# Patient Record
Sex: Female | Born: 1970 | Race: Asian | Hispanic: No | Marital: Married | Smoking: Never smoker
Health system: Southern US, Community
[De-identification: ages and names within clinical notes are randomized; demographics above are authoritative.]

## PROBLEM LIST (undated history)

## (undated) DIAGNOSIS — E785 Hyperlipidemia, unspecified: Secondary | ICD-10-CM

## (undated) DIAGNOSIS — I10 Essential (primary) hypertension: Secondary | ICD-10-CM

---

## 1998-05-14 ENCOUNTER — Other Ambulatory Visit: Admission: RE | Admit: 1998-05-14 | Discharge: 1998-05-14 | Payer: Self-pay | Admitting: Gynecology

## 1999-08-15 ENCOUNTER — Other Ambulatory Visit: Admission: RE | Admit: 1999-08-15 | Discharge: 1999-08-15 | Payer: Self-pay | Admitting: Gynecology

## 2002-01-21 ENCOUNTER — Inpatient Hospital Stay (HOSPITAL_COMMUNITY): Admission: AD | Admit: 2002-01-21 | Discharge: 2002-01-24 | Payer: Self-pay | Admitting: *Deleted

## 2002-03-12 ENCOUNTER — Other Ambulatory Visit: Admission: RE | Admit: 2002-03-12 | Discharge: 2002-03-12 | Payer: Self-pay | Admitting: *Deleted

## 2003-03-16 ENCOUNTER — Other Ambulatory Visit: Admission: RE | Admit: 2003-03-16 | Discharge: 2003-03-16 | Payer: Self-pay | Admitting: Gynecology

## 2004-03-18 ENCOUNTER — Other Ambulatory Visit: Admission: RE | Admit: 2004-03-18 | Discharge: 2004-03-18 | Payer: Self-pay | Admitting: Gynecology

## 2004-08-01 ENCOUNTER — Other Ambulatory Visit: Admission: RE | Admit: 2004-08-01 | Discharge: 2004-08-01 | Payer: Self-pay | Admitting: Gynecology

## 2005-03-24 ENCOUNTER — Other Ambulatory Visit: Admission: RE | Admit: 2005-03-24 | Discharge: 2005-03-24 | Payer: Self-pay | Admitting: Gynecology

## 2006-04-13 ENCOUNTER — Other Ambulatory Visit: Admission: RE | Admit: 2006-04-13 | Discharge: 2006-04-13 | Payer: Self-pay | Admitting: Gynecology

## 2009-04-28 ENCOUNTER — Ambulatory Visit: Payer: Self-pay | Admitting: Physician Assistant

## 2009-04-28 DIAGNOSIS — M722 Plantar fascial fibromatosis: Secondary | ICD-10-CM | POA: Insufficient documentation

## 2009-04-28 DIAGNOSIS — I10 Essential (primary) hypertension: Secondary | ICD-10-CM | POA: Insufficient documentation

## 2009-04-28 DIAGNOSIS — E785 Hyperlipidemia, unspecified: Secondary | ICD-10-CM

## 2009-04-28 LAB — CONVERTED CEMR LAB
AST: 15 units/L (ref 0–37)
Amphetamine Screen, Ur: NEGATIVE
Barbiturate Quant, Ur: NEGATIVE
Basophils Absolute: 0 10*3/uL (ref 0.0–0.1)
Benzodiazepines.: NEGATIVE
CO2: 23 meq/L (ref 19–32)
Chloride: 105 meq/L (ref 96–112)
Cocaine Metabolites: NEGATIVE
Creatinine,U: 164.4 mg/dL
Eosinophils Absolute: 0.1 10*3/uL (ref 0.0–0.7)
Eosinophils Relative: 2 % (ref 0–5)
HCT: 45.8 % (ref 36.0–46.0)
Lymphs Abs: 2.8 10*3/uL (ref 0.7–4.0)
MCHC: 31.9 g/dL (ref 30.0–36.0)
MCV: 90.5 fL (ref 78.0–100.0)
Marijuana Metabolite: NEGATIVE
Monocytes Absolute: 0.6 10*3/uL (ref 0.1–1.0)
Monocytes Relative: 7 % (ref 3–12)
Phencyclidine (PCP): NEGATIVE
Potassium: 4 meq/L (ref 3.5–5.3)
Total Bilirubin: 0.3 mg/dL (ref 0.3–1.2)
Total Protein: 7.2 g/dL (ref 6.0–8.3)

## 2009-04-29 ENCOUNTER — Encounter: Payer: Self-pay | Admitting: Physician Assistant

## 2009-05-04 ENCOUNTER — Encounter: Payer: Self-pay | Admitting: Physician Assistant

## 2009-05-05 ENCOUNTER — Telehealth (INDEPENDENT_AMBULATORY_CARE_PROVIDER_SITE_OTHER): Payer: Self-pay | Admitting: *Deleted

## 2009-05-06 ENCOUNTER — Encounter: Payer: Self-pay | Admitting: Physician Assistant

## 2009-05-27 ENCOUNTER — Ambulatory Visit: Payer: Self-pay | Admitting: Physician Assistant

## 2009-06-01 LAB — CONVERTED CEMR LAB: Triglycerides: 207 mg/dL — ABNORMAL HIGH (ref <150)

## 2009-06-09 ENCOUNTER — Ambulatory Visit: Payer: Self-pay | Admitting: Physician Assistant

## 2009-06-09 DIAGNOSIS — I10 Essential (primary) hypertension: Secondary | ICD-10-CM | POA: Insufficient documentation

## 2009-06-10 ENCOUNTER — Encounter: Payer: Self-pay | Admitting: Physician Assistant

## 2009-06-11 ENCOUNTER — Telehealth: Payer: Self-pay | Admitting: Physician Assistant

## 2009-08-16 ENCOUNTER — Ambulatory Visit: Payer: Self-pay | Admitting: Physician Assistant

## 2009-08-16 ENCOUNTER — Other Ambulatory Visit: Admission: RE | Admit: 2009-08-16 | Discharge: 2009-08-16 | Payer: Self-pay | Admitting: Internal Medicine

## 2009-08-16 DIAGNOSIS — H6120 Impacted cerumen, unspecified ear: Secondary | ICD-10-CM

## 2009-08-16 DIAGNOSIS — R319 Hematuria, unspecified: Secondary | ICD-10-CM

## 2009-08-16 LAB — CONVERTED CEMR LAB
Glucose, Urine, Semiquant: NEGATIVE
Nitrite: NEGATIVE
Protein, U semiquant: NEGATIVE
Specific Gravity, Urine: >=1.03
WBC Urine, dipstick: NEGATIVE
Whiff Test: NEGATIVE

## 2009-08-17 ENCOUNTER — Encounter: Payer: Self-pay | Admitting: Physician Assistant

## 2009-08-17 LAB — CONVERTED CEMR LAB
Alkaline Phosphatase: 32 units/L — ABNORMAL LOW (ref 39–117)
Bacteria, UA: NONE SEEN
Calcium: 9.7 mg/dL (ref 8.4–10.5)
Chloride: 105 meq/L (ref 96–112)
Cholesterol: 232 mg/dL — ABNORMAL HIGH (ref 0–200)
Crystals: NONE SEEN
Glucose, Bld: 86 mg/dL (ref 70–99)
HDL goal, serum: 40 mg/dL
Sodium: 139 meq/L (ref 135–145)
Total Bilirubin: 0.4 mg/dL (ref 0.3–1.2)
Total Protein: 7.6 g/dL (ref 6.0–8.3)
VLDL: 26 mg/dL (ref 0–40)
WBC, UA: NONE SEEN cells/hpf (ref <3)

## 2009-08-18 ENCOUNTER — Encounter: Payer: Self-pay | Admitting: Physician Assistant

## 2009-08-25 ENCOUNTER — Encounter: Payer: Self-pay | Admitting: Physician Assistant

## 2009-08-25 ENCOUNTER — Ambulatory Visit (HOSPITAL_COMMUNITY): Admission: RE | Admit: 2009-08-25 | Discharge: 2009-08-25 | Payer: Self-pay | Admitting: Internal Medicine

## 2009-08-25 IMAGING — MG MM DIGITAL SCREENING BILAT W/ CAD
6 series · 6 of 6 positions shown · non-contrast
Comparison: none

DG SCREEN MAMMOGRAM BILATERAL
Bilateral CC and MLO view(s) were taken.
Technologist: FLASH, RT, RM

DIGITAL SCREENING MAMMOGRAM WITH CAD:
The breast tissue is extremely dense.  There is no dominant mass, architectural distortion or 
calcification to suggest malignancy.
Images were processed with CAD.

[R CC]
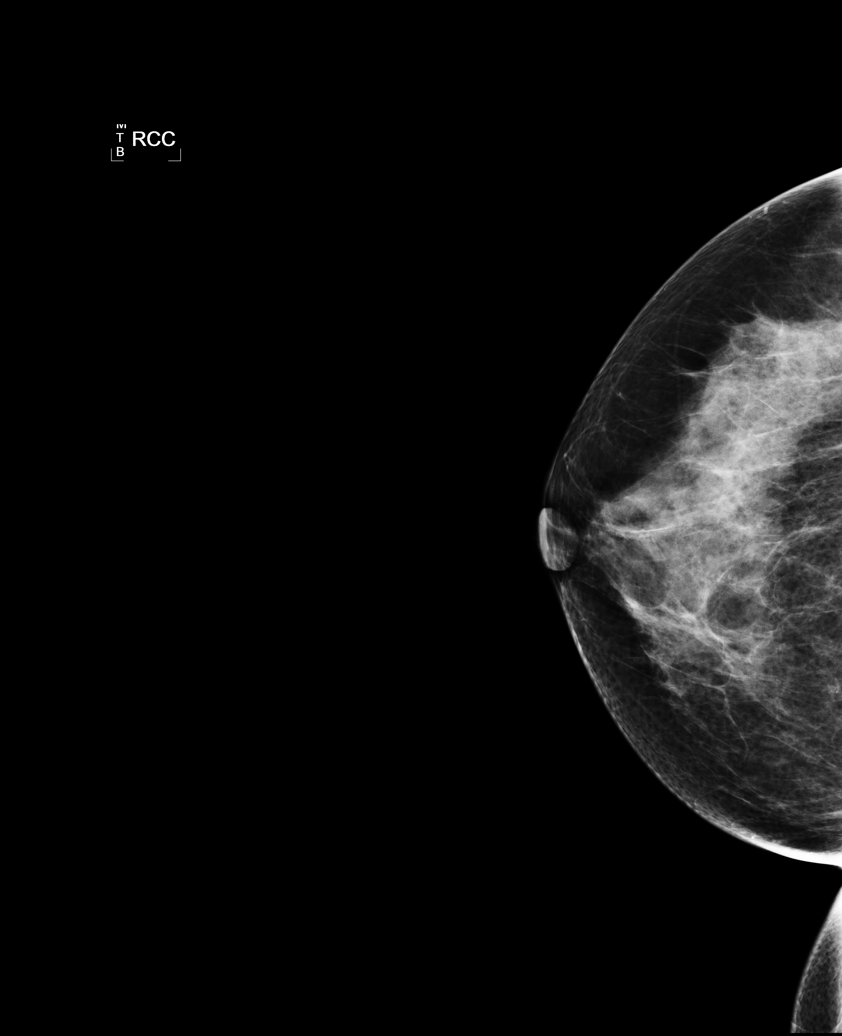

[R MLO (1 of 2)]
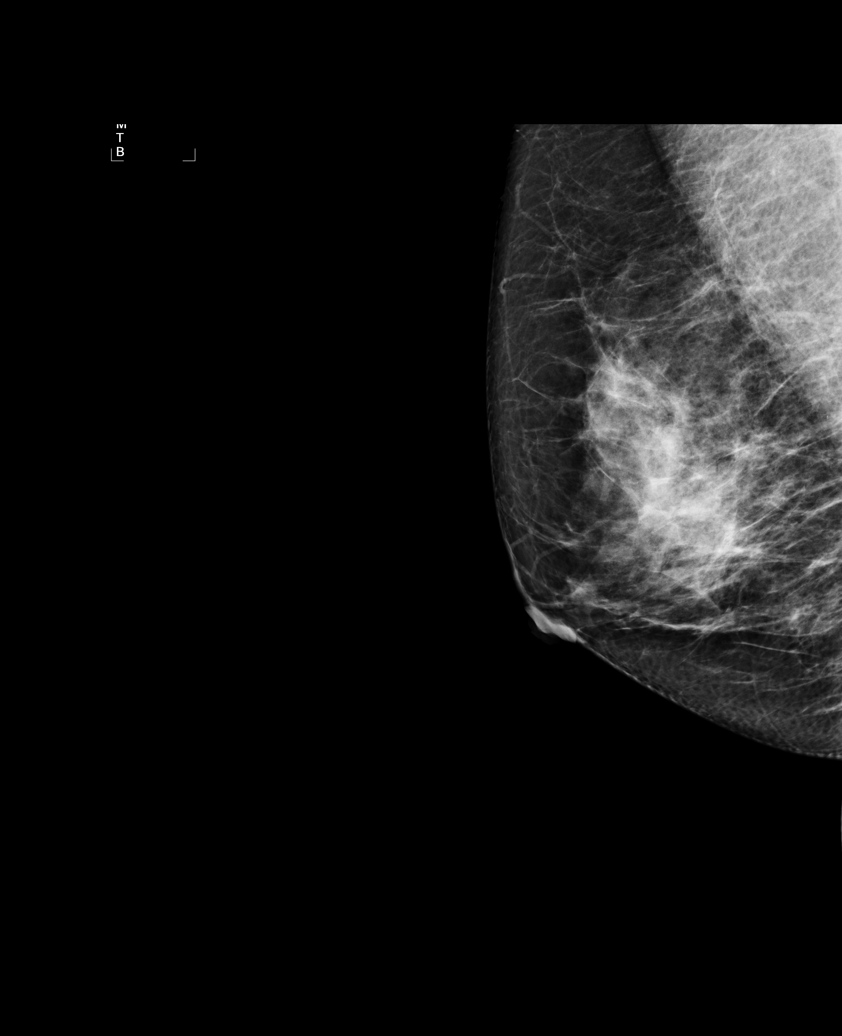

[L CC]
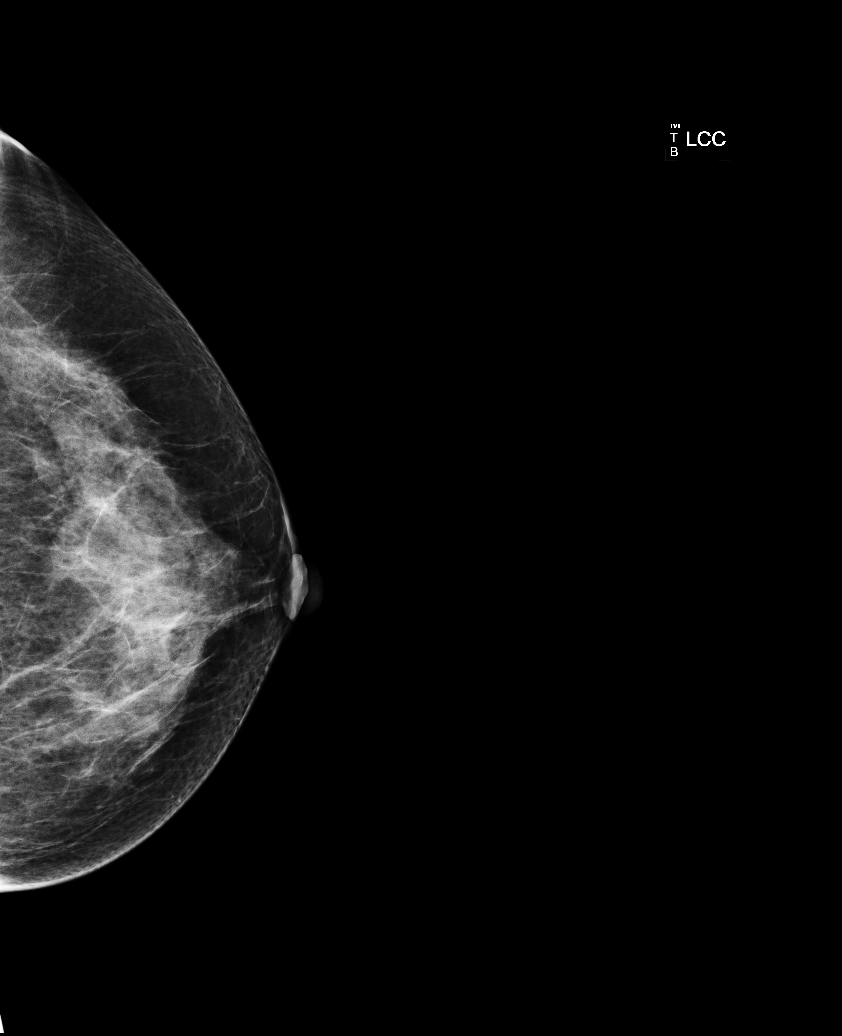

[L MLO (1 of 2)]
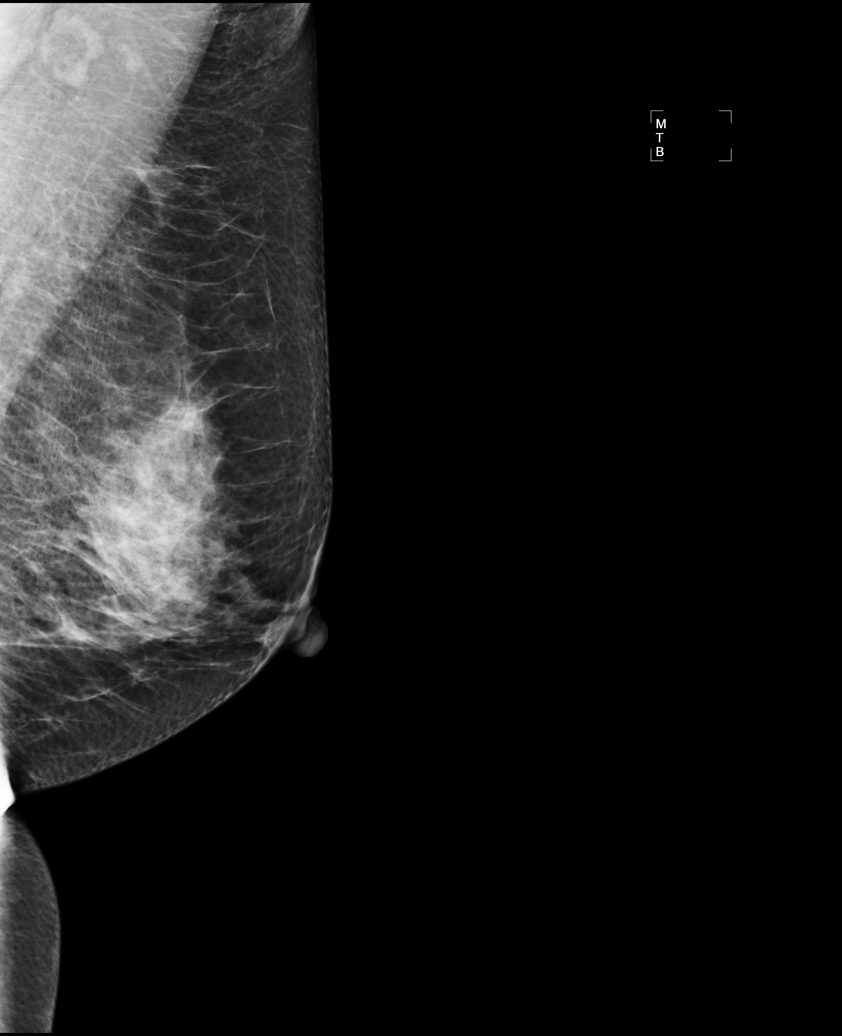

[L MLO (2 of 2)]
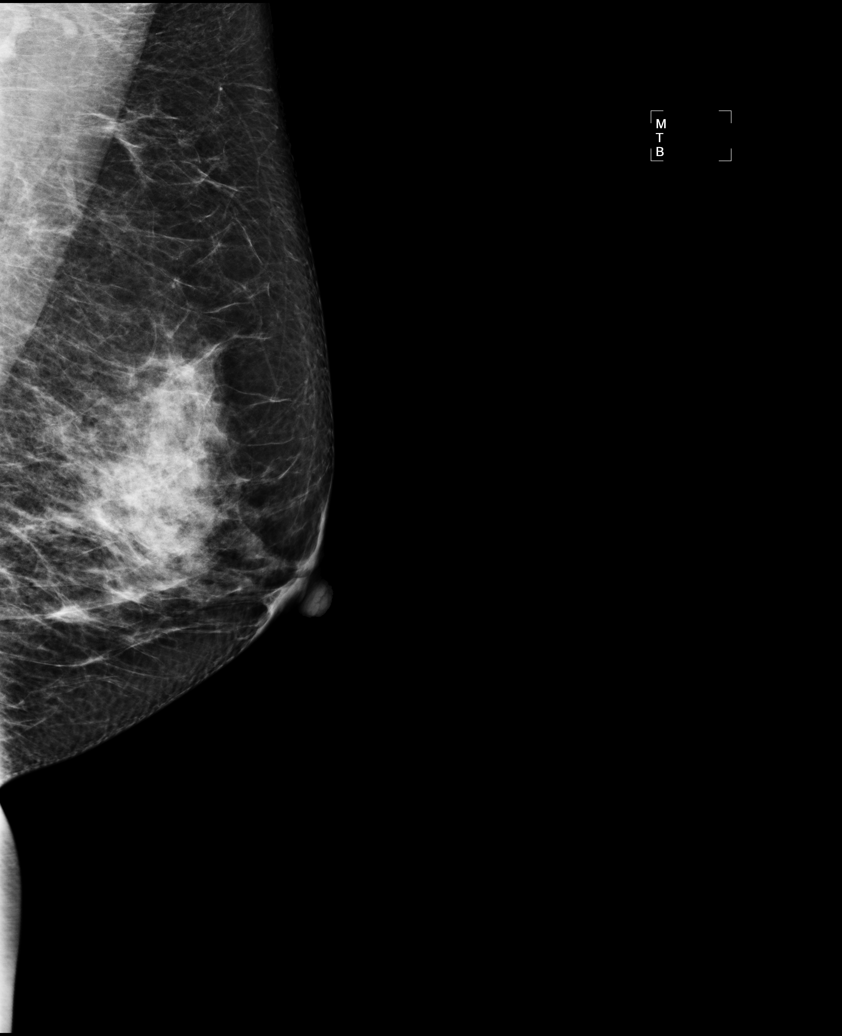

[R MLO (2 of 2)]
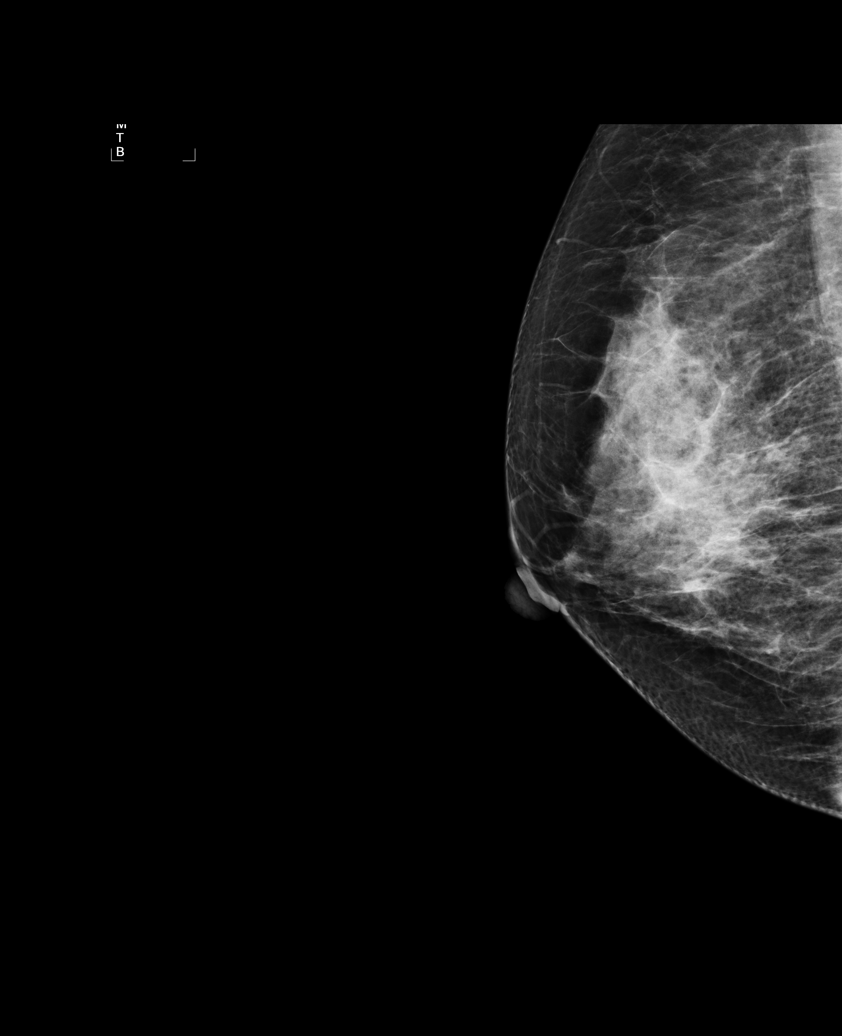

[6 of 6 positions shown; findings below may reference images not displayed]

IMPRESSION: No mammographic evidence of malignancy.  Suggest yearly screening mammography.

A result letter of this screening mammogram will be mailed directly to the patient.

ASSESSMENT: Negative - BI-RADS 1

Screening mammogram in 1 year.
,

## 2009-09-17 ENCOUNTER — Ambulatory Visit: Payer: Self-pay | Admitting: Nurse Practitioner

## 2009-09-17 DIAGNOSIS — J00 Acute nasopharyngitis [common cold]: Secondary | ICD-10-CM

## 2010-03-06 LAB — CONVERTED CEMR LAB
BUN: 11 mg/dL (ref 6–23)
Calcium: 9.5 mg/dL (ref 8.4–10.5)
Glucose, Bld: 101 mg/dL — ABNORMAL HIGH (ref 70–99)

## 2010-03-10 NOTE — Progress Notes (Signed)
  Phone Note Outgoing Call   Summary of Call: We cannot find a record of her pap or mammo. Rec'd correspondence from [facility]' and they have not seen her. Have her schedule a CPP in the next 3-6 months. Initial call taken by: Rich Rich,  May 05, 2009 3:22 PM  Follow-up for Phone Call        tried calling pt but no answer ,,..Rich Rich  May 11, 2009 11:42 AM Rich Rich can you reach this pt to schedule an appt in the next 3-6 months for CPP Follow-up by: Rich Rich,  May 11, 2009 11:42 AM  Additional Follow-up for Phone Call Additional follow up Details #1::        left a message with the contact person for the pt to call us back.Rich Rich  May 11, 2009 3:44 PM  The pt will come on July 11, at 9:30 am.Rich Rich  May 14, 2009 3:20 PM

## 2010-03-10 NOTE — Letter (Signed)
Summary: [facility] RECORDS [facility]NO REC[facility]DS FOUND  REQUESTED RECORDS [facility]//NO RECORDS FOUND   Imported By: Arta Bruce 05/06/2009 09:42:09  _____________________________________________________________________  External Attachment:    Type:   Image     Comment:   External Document

## 2010-03-10 NOTE — Letter (Signed)
Summary: *HSN Results Follow up  [facility]  [address] [place], [place] [address]   Phone: [phone]  Fax: [phone]      06/10/2009   Shannon Rich [address] [place], [place]  [address]   Dear  [name],                            ____S.Drinkard,FNP   ____D. Gore,FNP       ____B. McPherson,MD   ____V. Rankins,MD    ____E. Mulberry,MD    ____N. Daphine Deutscher, FNP  ____D. Reche Dixon, MD    ____K. Philipp Deputy, MD    __x__S. Alben Spittle, PA-C     This letter is to inform you that your recent test(s):  _______Pap Smear    __x_____Lab Test     _______X-ray    ____x___ is within acceptable limits  _______ requires a medication change  _______ requires a follow-up lab visit  _______ requires a follow-up visit with your provider   Comments:       _________________________________________________________ If you have any questions, please contact our office                     Sincerely,  Tereso Newcomer PA-C [facility]

## 2010-03-10 NOTE — Assessment & Plan Note (Signed)
Summary: cpp exam//gk   Vital Signs:  Patient profile:   41 year old female Menstrual status:  regular Height:      61.5 inches Weight:      149 pounds BMI:     27.80 Temp:     98.2 degrees F Pulse rate:   80 / minute Pulse rhythm:   regular Resp:     18 per minute BP sitting:   137 / 85  (left arm) Cuff size:   regular  Primary Care Provider:  Tereso Newcomer, PA-C  CC:  cpp.  History of Present Illness: Here for CPP. Health Maint: LMP 07/21/2009 No h/o abnormal paps. No abnormal bleeding or discharge. Periods regular.  No heavy bleeding.   No mammo in 2 years. PHQ9=0 Married.  Monogamous.  No h/o STD.   Hypertension History:      Positive major cardiovascular risk factors include hyperlipidemia and hypertension.  Negative major cardiovascular risk factors include female age less than 69 years old and non-tobacco-user status.    Habits & Providers  Alcohol-Tobacco-Diet     Alcohol drinks/day: 0     Tobacco Status: never  Exercise-Depression-Behavior     Have you felt down or hopeless? no     Have you felt little pleasure in things? no     Drug Use: never     Seat Belt Use: always  Problems Prior to Update: 1)  Hematuria Unspecified  (ICD-599.70) 2)  Cerumen Impaction, Right  (ICD-380.4) 3)  Essential Hypertension, Benign  (ICD-401.1) 4)  Plantar Fasciitis, Bilateral  (ICD-728.71) 5)  Preventive Health Care  (ICD-V70.0) 6)  Hyperlipidemia  (ICD-272.4)  Current Medications (verified): 1)  Hydrochlorothiazide 12.5 Mg Caps (Hydrochlorothiazide) .Marland Kitchen.. 1 By Mouth Once Daily 2)  Simvastatin 40 Mg Tabs (Simvastatin) .... Take 1 Tab By Mouth At Bedtime For Cholesterol 3)  Naproxen 500 Mg Tabs (Naproxen) .... Take 1 Tablet By Mouth Two Times A Day With Food As Needed For Pain 4)  Voltaren 1 % Gel (Diclofenac Sodium) .... Apply 4 Grams To Affected Area Up To 4 Times A Day As Needed For Pain  Allergies (verified): No Known Drug Allergies  Past History:  Past Medical  History: Last updated: 04/28/2009 Hyperlipidemia   a. not on meds  Past Surgical History: Last updated: 04/28/2009 Denies surgical history  Family History: Reviewed history from 04/28/2009 and no changes required. No colon, ovarian or breast cancer. unremarkable  Social History: Reviewed history from 04/28/2009 and no changes required. From [place] since 1992 3 kids (G3P3) Never Smoked Alcohol use-no Drug use-no Married Drug Use:  never Risk analyst Use:  always  Review of Systems      See HPI General:  Denies chills and fever. ENT:  Denies sore throat. CV:  Denies chest pain or discomfort, fainting, and shortness of breath with exertion. Resp:  Denies cough. GI:  Denies bloody stools and dark tarry stools. GU:  Denies dysuria and hematuria. MS:  both heels feeling better . Derm:  Denies rash. Neuro:  Denies headaches. Psych:  Denies depression. Endo:  Denies cold intolerance and heat intolerance. Heme:  Denies bleeding.  Physical Exam  General:  alert, well-developed, and well-nourished.   Head:  normocephalic and atraumatic.   Eyes:  pupils equal, pupils round, pupils reactive to light, and no optic disk abnormalities.   Ears:  L ear normal and R cerumen impaction.   Nose:  no external deformity.   Mouth:  pharynx pink and moist, no erythema, and  no exudates.   Neck:  supple, no thyromegaly, and no cervical lymphadenopathy.   Breasts:  skin/areolae normal, no masses, no abnormal thickening, no nipple discharge, no tenderness, and no adenopathy.   Lungs:  normal breath sounds, no crackles, and no wheezes.   Heart:  normal rate, regular rhythm, and no murmur.   Abdomen:  soft, non-tender, and no hepatomegaly.   Rectal:  no external abnormalities.   Genitalia:  normal introitus, no external lesions, no vaginal discharge, mucosa pink and moist, no vaginal or cervical lesions, no vaginal atrophy, no friaility or hemorrhage, normal uterus size and position, and  no adnexal masses or tenderness.   Msk:  normal ROM.   Pulses:  R posterior tibial normal, R dorsalis pedis normal, L posterior tibial normal, and L dorsalis pedis normal.   Extremities:  no edema  Neurologic:  alert & oriented X3 and cranial nerves II-XII intact.   Skin:  turgor normal.   Psych:  normally interactive.     Impression & Recommendations:  Problem # 1:  PREVENTIVE HEALTH CARE (ICD-V70.0)  Orders: KOH/ WET Mount [phone]) UA Dipstick w/o Micro (manual) (84132) T-Comprehensive Metabolic Panel ([id number]) T-Pap Smear, Thin Prep (66440) Mammogram (Screening) (Mammo)  Problem # 2:  ESSENTIAL HYPERTENSION, BENIGN (ICD-401.1) controlled  Her updated medication list for this problem includes:    Hydrochlorothiazide 12.5 Mg Caps (Hydrochlorothiazide) .Marland Kitchen... 1 by mouth once daily  Orders: UA Dipstick w/o Micro (manual) (34742) T-Comprehensive Metabolic Panel ([id number])  Problem # 3:  HYPERLIPIDEMIA (ICD-272.4) repeat labs today ok to cont simvastatin . . . no med interactions  Her updated medication list for this problem includes:    Simvastatin 40 Mg Tabs (Simvastatin) .Marland Kitchen... Take 1 tab by mouth at bedtime for cholesterol  Orders: T-Comprehensive Metabolic Panel ([phone]) T-Lipid Profile (66063-01601)  Problem # 4:  PLANTAR FASCIITIS, BILATERAL (ICD-728.71) Assessment: Improved advised her to use voltaren gel or naproxen; not both  Her updated medication list for this problem includes:    Naproxen 500 Mg Tabs (Naproxen) .Marland Kitchen... Take 1 tablet by mouth two times a day with food as needed for pain  Problem # 5:  CERUMEN IMPACTION, RIGHT (ICD-380.4)  Orders: Cerumen Impaction Removal (09323)  Problem # 6:  HEMATURIA UNSPECIFIED (ICD-599.70) check culture and microscopic if ok repeat u/a in 2-3 weeks  Orders: T-Culture, Urine ([id number]) T- * Misc. Laboratory test ([phone])  Complete Medication List: 1)  Hydrochlorothiazide 12.5 Mg Caps  (Hydrochlorothiazide) .Marland Kitchen.. 1 by mouth once daily 2)  Simvastatin 40 Mg Tabs (Simvastatin) .... Take 1 tab by mouth at bedtime for cholesterol 3)  Naproxen 500 Mg Tabs (Naproxen) .... Take 1 tablet by mouth two times a day with food as needed for pain 4)  Voltaren 1 % Gel (Diclofenac sodium) .... Apply 4 grams to affected area up to 4 times a day as needed for pain  Hypertension Assessment/Plan:      The patient's hypertensive risk group is category B: At least one risk factor (excluding diabetes) with no target organ damage.  Her calculated 10 year risk of coronary heart disease is 3 %.  Today's blood pressure is 137/85.  Her blood pressure goal is < 140/90.   Patient Instructions: 1)  Right ear flush today. 2)  Do not take Naprosyn and Voltaren gel together. 3)  Let me know if your heel pain gets worse.  We can refer you to a foot doctor if it does. 4)  Please schedule a follow-up appointment in 6 months with  Jaris Kohles for blood pressure and cholesterol. 5)     Laboratory Results   Urine Tests    Routine Urinalysis   Glucose: negative   (Normal Range: Negative) Bilirubin: small   (Normal Range: Negative) Ketone: negative   (Normal Range: Negative) Spec. Gravity: >=1.030   (Normal Range: 1.003-1.035) Blood: small   (Normal Range: Negative) pH: 5.5   (Normal Range: 5.0-8.0) Protein: negative   (Normal Range: Negative) Urobilinogen: 0.2   (Normal Range: 0-1) Nitrite: negative   (Normal Range: Negative) Leukocyte Esterace: negative   (Normal Range: Negative)      Wet Mount Source: vaginal WBC/hpf: 1-5 Bacteria/hpf: rare Clue cells/hpf: none  Negative whiff Yeast/hpf: none Wet Mount KOH: Negative Trichomonas/hpf: none

## 2010-03-10 NOTE — Assessment & Plan Note (Signed)
Summary: NP MEDICAID / FEET PROBLEMS /NS   Vital Signs:  Patient profile:   40 year old female Menstrual status:  regular LMP:     03/31/2009 Height:      61.5 inches Weight:      150.4 pounds BMI:     28.06 O2 Sat:      100 % Temp:     98.1 degrees F oral Pulse rate:   73 / minute Pulse rhythm:               regular Resp:     16 per minute BP sitting:   139 / 92  (left arm) Cuff size:   regular  Vitals Entered By: Geanie Cooley  (April 28, 2009 10:03 AM) CC: Pt is here for NP establishment. Pt states the heel of both of her feet hurts alot. Pt states it hurts the most when she is cold pt has been having the heel pain for about 6months. Pt states that at one time the heel turned black. Pt has had high Cholestreol in the past. Is Patient Diabetic? No Pain Assessment Patient in pain? yes     Location: heel of feet Intensity: 7 Type: burning Onset of pain  Sudden  Does patient need assistance? Functional Status Self care Ambulation Normal LMP (date): 03/31/2009     Menstrual Status regular Enter LMP: 03/31/2009   Primary Care Provider:  Tereso Newcomer, PA-C  CC:  Pt is here for NP establishment. Pt states the heel of both of her feet hurts alot. Pt states it hurts the most when she is cold pt has been having the heel pain for about 6months. Pt states that at one time the heel turned black. Pt has had high Cholestreol in the past..  History of Present Illness: New patient. From [place]. No healthcare in years.  Health maint: Pap done last year at [facility]. She plans to go back there for her paps and mammo's. Needs Td.  Has had bilat heel pain for about 6 mos.  Worse in am.  Stiff.  Wears flip flops most of the time.  Habits & Providers  Alcohol-Tobacco-Diet     Tobacco Status: never  Exercise-Depression-Behavior     Drug Use: no  Allergies (verified): No Known Drug Allergies  Past History:  Past Medical History: Hyperlipidemia   a. not on  meds  Past Surgical History: Denies surgical history  Family History: No colon, ovarian or breast cancer. unremarkable  Social History: From [place] since 1992 Divorced 3 kids (G3P3) Never Smoked Alcohol use-no Drug use-no Smoking Status:  never Drug Use:  no  Review of Systems      See HPI General:  Denies chills and fever. CV:  Denies chest pain or discomfort, fainting, and shortness of breath with exertion. Resp:  Denies cough. GI:  Denies bloody stools and dark tarry stools. GU:  Denies hematuria. Endo:  Complains of cold intolerance and heat intolerance.  Physical Exam  General:  alert, well-developed, and well-nourished.   Head:  normocephalic and atraumatic.   Eyes:  pupils equal, pupils round, pupils reactive to light, and xanthelasma.   Ears:  no external deformities.   Nose:  no external deformity.   Mouth:  pharynx pink and moist.   Neck:  supple, no thyromegaly, no carotid bruits, and no cervical lymphadenopathy.   Lungs:  normal breath sounds, no crackles, and no wheezes.   Heart:  normal rate and regular rhythm.   Abdomen:  soft and non-tender.   Msk:  bilat feet without deformity has some pain with palp over calcaneous callus at heels noted Pulses:  DP/PT 2+ bilat  Extremities:  no edema Neurologic:  alert & oriented X3 and cranial nerves II-XII intact.   Psych:  normally interactive.     Impression & Recommendations:  Problem # 1:  PREVENTIVE HEALTH CARE (ICD-V70.0) check cbc and TSH due to her cold and heat intol get records from [facility]  Orders: T-Comprehensive Metabolic Panel [phone]) T-CBC w/Diff [id number]) T-Drug Screen-Urine, (single) [id number]) T-HIV Antibody  (Reflex) ([id number]) T-Syphilis Test (RPR) (10272-53664) T-TSH (40347-42595)  Problem # 2:  HYPERLIPIDEMIA (ICD-272.4) return for lipid check  Problem # 3:  PLANTAR FASCIITIS, BILATERAL (ICD-728.71) discussed heel stretches and proper foot  wear gave handout return as needed  Other Orders: Tdap => 5yrs IM (63875) Admin 1st Vaccine (64332) Admin 1st Vaccine [facility]) ([phone])  Patient Instructions: 1)  Schedule fasting labs in next 2-4 weeks: Lipids.  Do not eat or drink anything after midnight the night before (except water). 2)  Have blood pressure check at lab visit. 3)  Return for blood pressure check in 6 weeks as well. 4)  Td shot today. 5)  Get record of last pap and mammo from [facility]. 6)  Take ibuprofen for 3-4 days straight for your foot, then as needed. 7)  Take 400-600 mg of Ibuprofen (Advil, Motrin) with food every 4-6 hours as needed  for relief of pain or comfort of fever.  8)  Please schedule a follow-up appointment as needed for your feet. 9)  Get heel inserts at the drug store and use when you are on your feet. 10)  Also, get tennis shoes to wear that have a lot of cushion in them. 11)  See the handout I gave you. 12)      Tetanus/Td Vaccine    Vaccine Type: Tdap    Site: left deltoid    Mfr: Sanofi Pasteur    Dose: 0.5 ml    Route: IM    Given by: [name]    Exp. Date: 05/14/2011    Lot #: [id number]    VIS given: 12/25/06 version given April 28, 2009.

## 2010-03-10 NOTE — Letter (Signed)
Summary: REQUESTING RECORDS FROM [facility]  REQUESTING RECORDS FROM [facility]   Imported By: Arta Bruce 05/18/2009 15:51:33  _____________________________________________________________________  External Attachment:    Type:   Image     Comment:   External Document

## 2010-03-10 NOTE — Letter (Signed)
Summary: *HSN Results Follow up  [facility]  [address] [place], [place] [address]   Phone: [phone]  Fax: [phone]      04/29/2009   Shannon Rich [address] [place], [place]  [address]   Dear  [name],                            ____S.Drinkard,FNP   ____D. Gore,FNP       ____B. McPherson,MD   ____V. Rankins,MD    ____E. Mulberry,MD    ____N. Daphine Deutscher, FNP  ____D. Reche Dixon, MD    ____K. Philipp Deputy, MD    __x__S. Alben Spittle, PA-C     This letter is to inform you that your recent test(s):  _______Pap Smear    ___x____Lab Test     _______X-ray    ___x____ is within acceptable limits  _______ requires a medication change  _______ requires a follow-up lab visit  _______ requires a follow-up visit with your provider   Comments:       _________________________________________________________ If you have any questions, please contact our office                     Sincerely,  Tereso Newcomer PA-C [facility]

## 2010-03-10 NOTE — Progress Notes (Signed)
  Phone Note Outgoing Call   Summary of Call: Shannon Rich wanted to use cream that she got from mom. Called in to inform me it was Voltaren Gel. She can use this as needed. She should NOT use the Voltaren and also take the Naproxen.  She is to use one or the other. Rx sent to [facility] Initial call taken by: Brynda Rim,  Jun 11, 2009 2:44 PM  Follow-up for Phone Call        pt aware Follow-up by: [name],  Jun 14, 2009 5:06 PM    New/Updated Medications: VOLTAREN 1 % GEL (DICLOFENAC SODIUM) apply 4 grams to affected area up to 4 times a day as needed for pain Prescriptions: VOLTAREN 1 % GEL (DICLOFENAC SODIUM) apply 4 grams to affected area up to 4 times a day as needed for pain  #1 tube x 3   Entered and Authorized by:   Tereso Newcomer PA-C   Signed by:   Tereso Newcomer PA-C on 06/11/2009   Method used:   Electronically to        [facility] [address] (retail)       [phone]       [place], [place]  [address]       Ph: [phone]       Fax: [phone]   RxID:   [id number]

## 2010-03-10 NOTE — Assessment & Plan Note (Signed)
Summary: Acute - Nasopharyngitis   Vital Signs:  Patient profile:   40 year old female Menstrual status:  regular Weight:      150.1 pounds BMI:     28.00 Temp:     97.9 degrees F oral Pulse rate:   82 / minute Pulse rhythm:   regular Resp:     20 per minute BP sitting:   126 / 84  (left arm) Cuff size:   regular  Vitals Entered By: Levon Hedger (September 17, 2009 3:52 PM)  Nutrition Counseling: Patient's BMI is greater than 25 and therefore counseled on weight management options. CC: cough x 2 days and headache and congestion, Cough Is Patient Diabetic? No Pain Assessment Patient in pain? no       Does patient need assistance? Functional Status Self care Ambulation Normal   Primary Care Provider:  Tereso Newcomer, PA-C  CC:  cough x 2 days and headache and congestion and Cough.  History of Present Illness:  Cough      This is a Shannon Rich who presents with Cough.  The symptoms began 2 days ago.  The intensity is described as mild.  The patient reports non-productive cough, but denies shortness of breath, wheezing, and fever.  The patient denies the following symptoms: sore throat, nasal congestion, and chronic rhinitis.  Ineffective prior treatments have included OTC cough medication.   Denies any sick contacts.  Pt presents today with pt - simvastatin and HCTZ  Allergies (verified): No Known Drug Allergies  Review of Systems General:  Denies fever. CV:  Denies chest pain or discomfort. Resp:  Complains of cough; denies shortness of breath and wheezing. GI:  Denies abdominal pain, indigestion, nausea, and vomiting. Neuro:  Complains of headaches.  Physical Exam  General:  alert.   Head:  normocephalic.   Eyes:  pupils equal and pupils round.   Ears:  bil TM with bony landmarks Mouth:  pharynx pink and moist and poor dentition.   Lungs:  normal breath sounds.   Heart:  normal rate and regular rhythm.   Abdomen:  normal bowel sounds.   Msk:  normal  ROM.   Neurologic:  alert & oriented X3.     Impression & Recommendations:  Problem # 1:  ACUTE NASOPHARYNGITIS (ICD-460) advised pt to take cough meds as needed - symptoms likely viral  Her updated medication list for this problem includes:    Naproxen 500 Mg Tabs (Naproxen) .Marland Kitchen... Take 1 tablet by mouth two times a day with food as needed for pain    Ceron-dm 12.06-09-13 Mg/41ml Syrp (Phenylephrine-chlorphen-dm) ..... One teaspoon every 6 hours as needed for cough  Complete Medication List: 1)  Hydrochlorothiazide 12.5 Mg Caps (Hydrochlorothiazide) .Marland Kitchen.. 1 by mouth once daily 2)  Simvastatin 40 Mg Tabs (Simvastatin) .... Take 1 tab by mouth at bedtime for cholesterol 3)  Naproxen 500 Mg Tabs (Naproxen) .... Take 1 tablet by mouth two times a day with food as needed for pain 4)  Voltaren 1 % Gel (Diclofenac sodium) .... Apply 4 grams to affected area up to 4 times a day as needed for pain 5)  Ceron-dm 12.06-09-13 Mg/91ml Syrp (Phenylephrine-chlorphen-dm) .... One teaspoon every 6 hours as needed for cough  Patient Instructions: 1)  Gargle with warm salt water 2)  Take cough meds every 6 hours as needed for cough 3)  May take aleve as needed for pain (take with food) Prescriptions: CERON-DM 12.06-09-13 MG/5ML SYRP (PHENYLEPHRINE-CHLORPHEN-DM) One teaspoon every 6 hours  as needed for cough  #175ml x 0   Entered and Authorized by:   Lehman Prom FNP   Signed by:   Lehman Prom FNP on 09/17/2009   Method used:   Print then Give to Patient   RxID:   [id number]

## 2010-03-10 NOTE — Letter (Signed)
Summary: PT INFORMATION SHEET  PT INFORMATION SHEET   Imported By: Arta Bruce 07/08/2009 12:50:06  _____________________________________________________________________  External Attachment:    Type:   Image     Comment:   External Document

## 2010-03-10 NOTE — Letter (Signed)
Summary: *HSN Results Follow up  [facility]  [address] [place], [place] [address]   Phone: [phone]  Fax: [phone]      08/18/2009   Shannon Rich [address] [place], [place]  [address]   Dear  [name],                            ____S.Drinkard,FNP   ____D. Gore,FNP       ____B. McPherson,MD   ____V. Rankins,MD    ____E. Mulberry,MD    ____N. Daphine Deutscher, FNP  ____D. Reche Dixon, MD    ____K. Philipp Deputy, MD    __x__S. Alben Spittle, PA-C     This letter is to inform you that your recent test(s):  ___x____Pap Smear    _______Lab Test     _______X-ray    ___x____ is normal  _______ requires a medication change  _______ requires a follow-up lab visit  _______ requires a follow-up visit with your provider   Comments:       _________________________________________________________ If you have any questions, please contact our office                     Sincerely,  Tereso Newcomer PA-C [facility]

## 2010-03-10 NOTE — Letter (Signed)
Summary: GYNECOLOGIC CYTOLOGY REPORT  GYNECOLOGIC CYTOLOGY REPORT   Imported By: Arta Bruce 08/25/2009 11:34:03  _____________________________________________________________________  External Attachment:    Type:   Image     Comment:   External Document

## 2010-03-10 NOTE — Assessment & Plan Note (Signed)
Summary: 6 WEEK FU ON BP////KT   Vital Signs:  Patient profile:   40 year old female Menstrual status:  regular Height:      61.5 inches Weight:      149 pounds BMI:     27.80 Temp:     97.9 degrees F oral Pulse rate:   76 / minute Pulse rhythm:   regular Resp:     18 per minute BP sitting:   139 / 93  (left arm) Cuff size:   regular  Vitals Entered By: [name] (Jun 09, 2009 8:36 AM)  Serial Vital Signs/Assessments:  Time      Position  BP       Pulse  Resp  Temp     By 9:04 AM             138/88                         Tereso Newcomer PA-C  CC: six week f/u..., Hypertension Management Is Patient Diabetic? No Pain Assessment Patient in pain? no       Does patient need assistance? Functional Status Self care Ambulation Normal   Primary Care Provider:  Tereso Newcomer, PA-C  CC:  six week f/u... and Hypertension Management.  History of Present Illness: Here for f/u on blood pressure.  No meds this am.  Tolerating meds well.  Just started on Simvastatin for cholesterol.    Plantar fasciitis:  Doing stretches.  Still has a lot of pain.  Got new sandals.  Cannot wear tennis shoes that much.  Has cultural constraints.  Overall, pain is somewhat better with stretches.  Still stiff in am.    Hypertension History:      She denies headache, chest pain, dyspnea with exertion, syncope, and side effects from treatment.        Positive major cardiovascular risk factors include hyperlipidemia and hypertension.  Negative major cardiovascular risk factors include female age less than 85 years old and non-tobacco-user status.     Current Medications (verified): 1)  Hydrochlorothiazide 12.5 Mg Caps (Hydrochlorothiazide) .Marland Kitchen.. 1 By Mouth Once Daily 2)  Simvastatin 40 Mg Tabs (Simvastatin) .... Take 1 Tab By Mouth At Bedtime For Cholesterol  Allergies (verified): No Known Drug Allergies  Physical Exam  General:  alert, well-developed, and well-nourished.   Head:  normocephalic  and atraumatic.   Neck:  supple.   Lungs:  normal breath sounds.   Heart:  normal rate and regular rhythm.   Extremities:  no edema  Neurologic:  alert & oriented X3 and cranial nerves II-XII intact.   Psych:  normally interactive.     Impression & Recommendations:  Problem # 1:  ESSENTIAL HYPERTENSION, BENIGN (ICD-401.1)  did not take meds today continue current meds her friend is a Armed forces technical officer and will monitor for her recheck at CPP in July  Her updated medication list for this problem includes:    Hydrochlorothiazide 12.5 Mg Caps (Hydrochlorothiazide) .Marland Kitchen... 1 by mouth once daily  Orders: T-Basic Metabolic Panel [phone]) T-Urinalysis ([id number])  Problem # 2:  HYPERLIPIDEMIA (ICD-272.4) just started on meds check FLP and LFTs in 3 mos discussed avoiding pregnancy she does not want any future pregnancies  Her updated medication list for this problem includes:    Simvastatin 40 Mg Tabs (Simvastatin) .Marland Kitchen... Take 1 tab by mouth at bedtime for cholesterol  Problem # 3:  PLANTAR FASCIITIS, BILATERAL (ICD-728.71)  still painful advised her to  continue conservative mgmt she is unable to wear tennis shoes due to cultural issues if continues to have problems, will send her to podiatry tylenol as needed; Naproxen as needed advised her to avoid using naproxen every day with HTN  Her updated medication list for this problem includes:    Naproxen 500 Mg Tabs (Naproxen) .Marland Kitchen... Take 1 tablet by mouth two times a day with food as needed for pain  Problem # 4:  PREVENTIVE HEALTH CARE (ICD-V70.0) CPP scheduled in July  Complete Medication List: 1)  Hydrochlorothiazide 12.5 Mg Caps (Hydrochlorothiazide) .Marland Kitchen.. 1 by mouth once daily 2)  Simvastatin 40 Mg Tabs (Simvastatin) .... Take 1 tab by mouth at bedtime for cholesterol 3)  Naproxen 500 Mg Tabs (Naproxen) .... Take 1 tablet by mouth two times a day with food as needed for pain  Hypertension Assessment/Plan:      The  patient's hypertensive risk group is category B: At least one risk factor (excluding diabetes) with no target organ damage.  Her calculated 10 year risk of coronary heart disease is 5 %.  Today's blood pressure is 139/93.  Her blood pressure goal is < 140/90.  Patient Instructions: 1)  Take 650 - 1000 mg of tylenol every 4-6 hours as needed for relief of pain or comfort of fever. Avoid taking more than 4000 mg in a 24 hour period( can cause liver damage in higher doses).  2)  If tylenol does not help, you can use the Naproxen.  Avoid using Naproxen every day because it will make your blood pressure go up. 3)  Take naproxen with food to protect your stomach. 4)  Keep an eye on your blood pressure.  Let me know if it stays above 140/90. 5)  Schedule fasting Lipids and LFTs in 3 months (Dx 272.4).  Do not eat or drink anything after midnight the night before except water. 6)  Keep appointment with Lorin Picket in July for CPP. Prescriptions: NAPROXEN 500 MG TABS (NAPROXEN) Take 1 tablet by mouth two times a day with food as needed for pain  #30 x 3   Entered and Authorized by:   Tereso Newcomer PA-C   Signed by:   Tereso Newcomer PA-C on 06/09/2009   Method used:   Print then Give to Patient   RxID:   [id number]    EKG  Procedure date:  06/09/2009  Findings:      Normal sinus rhythm with rate of:  79 normal axis insig Qs in 2,3, avF borderline LVH j point elevation nonspecific ST-TW changes

## 2010-03-10 NOTE — Letter (Signed)
Summary: Handout Printed  Printed Handout:  - Cold, Common, Adult

## 2010-04-01 ENCOUNTER — Telehealth (INDEPENDENT_AMBULATORY_CARE_PROVIDER_SITE_OTHER): Payer: Self-pay | Admitting: Nurse Practitioner

## 2010-04-14 ENCOUNTER — Encounter (INDEPENDENT_AMBULATORY_CARE_PROVIDER_SITE_OTHER): Payer: Self-pay | Admitting: Nurse Practitioner

## 2010-04-14 ENCOUNTER — Encounter: Payer: Self-pay | Admitting: Nurse Practitioner

## 2010-04-14 DIAGNOSIS — J309 Allergic rhinitis, unspecified: Secondary | ICD-10-CM | POA: Insufficient documentation

## 2010-04-14 NOTE — Progress Notes (Signed)
Summary: Refill HCTZ and simvastatin  Phone Note Call from Patient Call back at Home Phone [phone]   Reason for Call: Refill Medication Summary of Call: Shannon Rich. MS Pettry, WHO IS [name], SAYS THAT SHE NEEDS A REFILL ON HER BP MEDICATION AND HER CHOLESTEROL MEDICATION AND SHE USES [facility] ON [address]. Initial call taken by: Leodis Rains,  April 01, 2010 11:36 AM  Follow-up for Phone Call        Pt. not seen since 09/2009 -- OK to refill per protocol per Jesse Fall.  Refill for simvastatin completed.  Still has refills available for HCTZ. Pharmacy will fill as well.  Unable to leave message for pt. -- no answer.  Dutch Quint RN  April 01, 2010 4:13 PM   Pt. notified of refills.  Dutch Quint RN  April 04, 2010 3:36 PM     Prescriptions: SIMVASTATIN 40 MG TABS (SIMVASTATIN) Take 1 tab by mouth at bedtime for cholesterol  #30 x 3   Entered by:   Dutch Quint RN   Authorized by:   Lehman Prom FNP   Signed by:   Dutch Quint RN on 04/01/2010   Method used:   Electronically to        [facility] [address] (retail)       [id number]       [place], [place]  [address]       Ph: [phone]       Fax: [phone]   RxID:   [id number]

## 2010-04-19 NOTE — Assessment & Plan Note (Signed)
Summary: Allergic Rhinitis   Vital Signs:  Patient profile:   40 year old female Menstrual status:  regular LMP:     04/2010 Weight:      155.8 pounds BMI:     29.07 Temp:     98.1 degrees F oral Pulse rate:   79 / minute Pulse rhythm:   regular Resp:     20 per minute BP sitting:   150 / 90  (left arm) Cuff size:   regular  Vitals Entered By: Levon Hedger (April 14, 2010 12:16 PM)  Nutrition Counseling: Patient's BMI is greater than 25 and therefore counseled on weight management options. CC: allergies, runny eyes, cough at night, Lipid Management, Hypertension Management Is Patient Diabetic? No Pain Assessment Patient in pain? no       Does patient need assistance? Functional Status Self care Ambulation Normal LMP (date): 04/2010     Enter LMP: 04/2010 Last PAP Result NEGATIVE FOR INTRAEPITHELIAL LESIONS OR MALIGNANCY.   Primary Care Provider:  Tereso Newcomer, PA-C  CC:  allergies, runny eyes, cough at night, Lipid Management, and Hypertension Management.  History of Present Illness:  Pt into the office with c/o allergy problems +itchy eyes +nasal drainage She took some OTC meds which she purchased for immediate relief  Hypertension History:      She denies headache, chest pain, palpitations, PND, neurologic problems, and syncope.  pt has taken some OTC meds.        Positive major cardiovascular risk factors include hyperlipidemia and hypertension.  Negative major cardiovascular risk factors include female age less than 50 years old, no history of diabetes, and non-tobacco-user status.        Further assessment for target organ damage reveals no history of ASHD, stroke/TIA, or peripheral vascular disease.    Lipid Management History:      Positive NCEP/ATP III risk factors include hypertension.  Negative NCEP/ATP III risk factors include female age less than 56 years old, non-diabetic, non-tobacco-user status, no ASHD (atherosclerotic heart disease), no prior  stroke/TIA, no peripheral vascular disease, and no history of aortic aneurysm.        She expresses no side effects from her lipid-lowering medication.  The patient denies any symptoms to suggest myopathy or liver disease.  Comments: pt is not fasting today for labs.    Habits & Providers  Alcohol-Tobacco-Diet     Alcohol drinks/day: 0     Tobacco Status: never  Exercise-Depression-Behavior     Does Patient Exercise: no     Have you felt down or hopeless? no     Have you felt little pleasure in things? no     Depression Counseling: not indicated; screening negative for depression     Drug Use: never     Seat Belt Use: always  Allergies (verified): No Known Drug Allergies  Social History: Does Patient Exercise:  no  Review of Systems General:  Denies fever. ENT:  Complains of sinus pressure. CV:  Denies chest pain or discomfort. Resp:  Denies cough. GI:  Denies abdominal pain, nausea, and vomiting. Allergy:  Complains of seasonal allergies and sneezing.  Physical Exam  General:  alert.   Head:  normocephalic.   Eyes:  pupils round.   Ears:  bil tm with clear landmarks Lungs:  normal breath sounds.   Heart:  normal rate and regular rhythm.   Neurologic:  alert & oriented X3.     Impression & Recommendations:  Problem # 1:  ALLERGIC RHINITIS (ICD-477.9)  Her updated medication list for this problem includes:    Loratadine 10 Mg Tabs (Loratadine) ..... One tablet by mouth daily for allergies  Problem # 2:  ESSENTIAL HYPERTENSION, BENIGN (ICD-401.1) BP slightly elevated today perhaps due to otc meds - advised pt not to take Her updated medication list for this problem includes:    Hydrochlorothiazide 12.5 Mg Caps (Hydrochlorothiazide) .Marland Kitchen... 1 by mouth once daily  Problem # 3:  HYPERLIPIDEMIA (ICD-272.4) pt is not fasting today for labs pt to keep taking meds and will schedule fasting lab visit Her updated medication list for this problem includes:     Simvastatin 40 Mg Tabs (Simvastatin) .Marland Kitchen... Take 1 tab by mouth at bedtime for cholesterol  Complete Medication List: 1)  Hydrochlorothiazide 12.5 Mg Caps (Hydrochlorothiazide) .Marland Kitchen.. 1 by mouth once daily 2)  Simvastatin 40 Mg Tabs (Simvastatin) .... Take 1 tab by mouth at bedtime for cholesterol 3)  Naproxen 500 Mg Tabs (Naproxen) .... Take 1 tablet by mouth two times a day with food as needed for pain 4)  Loratadine 10 Mg Tabs (Loratadine) .... One tablet by mouth daily for allergies  Hypertension Assessment/Plan:      The patient's hypertensive risk group is category B: At least one risk factor (excluding diabetes) with no target organ damage.  Her calculated 10 year risk of coronary heart disease is 3 %.  Today's blood pressure is 150/90.  Her blood pressure goal is < 140/90.  Lipid Assessment/Plan:      Based on NCEP/ATP III, the patient's risk factor category is "0-1 risk factors".  The patient's lipid goals are as follows: Total cholesterol goal is 200; LDL cholesterol goal is 160; HDL cholesterol goal is 40; Triglyceride goal is 150.    Patient Instructions: 1)  Schedule fasting lab visit in 2 weeks for lipids and lft (272.0) 2)  Allergies - take loratadine 10mg  by mouth daily 3)  Do not take over the counter medications because they will raise your blood pressure. Prescriptions: NAPROXEN 500 MG TABS (NAPROXEN) Take 1 tablet by mouth two times a day with food as needed for pain  #30 x 0   Entered and Authorized by:   Lehman Prom FNP   Signed by:   Lehman Prom FNP on 04/14/2010   Method used:   Print then Give to Patient   RxID:   [id number] LORATADINE 10 MG TABS (LORATADINE) One tablet by mouth daily for allergies  #30 x 5   Entered and Authorized by:   Lehman Prom FNP   Signed by:   Lehman Prom FNP on 04/14/2010   Method used:   Print then Give to Patient   RxID:   [id number]    Orders Added: 1)  Est. Patient Level III [95284]

## 2010-04-28 ENCOUNTER — Encounter (INDEPENDENT_AMBULATORY_CARE_PROVIDER_SITE_OTHER): Payer: Self-pay | Admitting: Nurse Practitioner

## 2010-04-29 LAB — CONVERTED CEMR LAB
ALT: 12 units/L (ref 0–35)
AST: 18 units/L (ref 0–37)
Albumin: 4.6 g/dL (ref 3.5–5.2)
Alkaline Phosphatase: 37 units/L — ABNORMAL LOW (ref 39–117)
HDL: 65 mg/dL (ref >39)
LDL Cholesterol: 241 mg/dL — ABNORMAL HIGH (ref 0–99)
Total Bilirubin: 0.6 mg/dL (ref 0.3–1.2)
Total CHOL/HDL Ratio: 5
Total Protein: 8.1 g/dL (ref 6.0–8.3)

## 2010-06-24 NOTE — Discharge Summary (Signed)
   [name], Shannon Rich                          ACCOUNT NO.:  [id number]   MEDICAL RECORD NO.:  [id number]                   PATIENT TYPE:  INP   LOCATION:  9145                                 FACILITY:  [facility]   PHYSICIAN:  Juan H. Lily Peer, M.D.             DATE OF BIRTH:  December 11, 1970   DATE OF ADMISSION:  01/21/2002  DATE OF DISCHARGE:  01/24/2002                                 DISCHARGE SUMMARY   DISCHARGE DIAGNOSES:  1. Intrauterine pregnancy 39 weeks, delivered.  2. Breech presentation in labor.  3. Status post primary low transverse cesarean section by Katy Fitch,     M.D. on January 21, 2002.  4. Anemia.   HISTORY:  This is a 40 year old female gravida 3, para 2 with an EDC of  January 25, 2002.  Prenatal course was uncomplicated.   HOSPITAL COURSE:  On January 21, 2002 patient presented at 39 weeks in  labor.  Was noted to be 3 cm.  After epidural and patient was comfortable,  patient was rechecked and a bed side ultrasound was ordered and revealed  complete breech.  Therefore, patient underwent a primary low transverse  cesarean section by Katy Fitch, M.D. and on January 21, 2002 at 1834  underwent delivery of a female Apgars of 9 and 9, weight of 7 pounds 12  ounces.  There were no complications.  Postoperatively patient remained  afebrile, voiding, stable condition and she was discharged to home in  satisfactory condition on January 24, 2002 and given [facility]  postpartum instruction/postpartum booklet.   ACCESSORY CLINICAL FINDINGS:  Laboratories:  The patient is O+.  Rubella  immune.  On January 22, 2002 hemoglobin was 8.7.   DISPOSITION:  The patient is discharged to home.  Given prescription for  Tylox to take p.r.n. pain.  Can take iron on a daily basis.  If she had any  problems prior to her postpartum check she is to be seen in the office.     Susa Loffler, P.A.                    Juan H. Lily Peer, M.D.    TSG/MEDQ  D:   02/28/2002  T:  02/28/2002  Job:  [id number]

## 2010-06-24 NOTE — Op Note (Signed)
NAME:  Shannon Rich, Shannon Rich                          ACCOUNT NO.:  [id number]   MEDICAL RECORD NO.:  [id number]                   PATIENT TYPE:  INP   LOCATION:  9164                                 FACILITY:  [facility]   PHYSICIAN:  Katy Fitch, M.D.               DATE OF BIRTH:  05-Nov-1970   DATE OF PROCEDURE:  01/21/2002  DATE OF DISCHARGE:                                 OPERATIVE REPORT   PREOPERATIVE DIAGNOSES:  1. At 39 weeks, labor.  2. Complete breech presentation.   POSTOPERATIVE DIAGNOSES:  1. At 39 weeks, labor.  2. Complete breech presentation.   PROCEDURE:  Primary low transverse cesarean section.   SURGEON:  Katy Fitch, M.D.   ANESTHESIA:  Epidural.   ESTIMATED BLOOD LOSS:  600.   URINE OUTPUT:  500.   FLUIDS REPLACED:  1800 crystalloid.   COMPLICATIONS:  None.   SPECIMENS:  Cord blood.   FINDINGS:  Living 7 pound 73 ounce female with Apgars of 9/9.  Three vessel  cord.  Breech presentation with normal appearing uterus, tubes, and ovaries.   PROCEDURE:  The patient was taken to the operating room and anesthesia was  checked with Allis clamp.  The patient was prepped and draped in a normal  sterile fashion.  The patient was then rechecked with Allis and noted to be  adequately anesthetized.  Pfannenstiel incision was made with a scalpel,  carried down to the underlying fascia.  Fascia was incised and extended  transversely using curved Mayo scissors.  The rectus muscles were then  dissected off both bluntly and sharply using curved Mayo scissors.  The  underlying peritoneum was then entered bluntly.  The peritoneum was then  entered superiorly and inferiorly to the level of the bladder.  The bladder  blade was placed into the pelvis and the vesicouterine peritoneum identified  and excised with Metzenbaum scissors and extended laterally.  Bladder flap  was then created digitally.  Bladder blade placed in the newly created  bladder flap.  Low transverse  uterine incision was made with a scalpel and  extended manually.  The infant was then delivered in a breech presentation  with reducing legs first and then a normal delivery without any  complications.  The mouth and nose were suctioned.  The cord was clamped and  cut, and the infant handed off to the waiting NICU attendants.  Cord blood  was obtained.  Placenta was massaged from the uterus.  The uterus was  exteriorized and cleared of all clots and debris.  A running 1-0 chromic  interlocking stitch was used to close the lower uterine segment.  The  incision was then packed and the posterior cul-de-sac was cleared of all  clots and debris and irrigated.  A second running 1-0 chromic interlocking  stitch was used to maintain hemostasis of the incision which was noted to be  adequate.  The uterus  was then returned to the cul-de-sac.  The pelvis was  irrigated with warm normal saline approximately 1 L.  There was an area on  the right incision which a figure-of-eight was placed to maintain hemostasis  which was noted after irrigating.  The peritoneum was then reapproximated  with two interrupted stitches.  The fascia was then closed using a running 0  Vicryl stitch.  The subcutaneous tissue was irrigated with warm normal  saline and skin closed using staples.  The patient was taken to recovery  room in stable condition.                                               Katy Fitch, M.D.    DC/MEDQ  D:  01/21/2002  T:  01/22/2002  Job:  [id number]

## 2010-06-24 NOTE — H&P (Signed)
   NAME:  Shannon Rich, Shannon Rich                          ACCOUNT NO.:  [id number]   MEDICAL RECORD NO.:  [id number]                   PATIENT TYPE:  INP   LOCATION:  9164                                 [facility]ACILITY:  [facility]   PHYSICIAN:  Katy Fitch, M.D.               DATE OF BIRTH:  January 01, 1971   DATE OF ADMISSION:  01/21/2002  DATE OF DISCHARGE:                                HISTORY & PHYSICAL   CHIEF COMPLAINT:  Contractions.   HISTORY OF PRESENT ILLNESS:  The patient is a 40 year old Gravida III, Para  II at 39 weeks who presents in labor, contracting every three minutes to  triage. The patient denies any rupture of membranes, vaginal bleeding,  fevers, nausea and vomiting, chills, seizure, bowel or bladder habits. The  patient has had no prenatal complications. The patient was noted to be 3 cm,  contracting every three minutes and having moderate to severe pain with her  contractions.   PAST MEDICAL HISTORY:  None.   PAST SURGICAL HISTORY:  None.   MEDICATIONS:  Prenatal vitamins.   ALLERGIES:  No known drug allergies.   SOCIAL HISTORY:  Denies tobacco, alcohol, or drugs.   FAMILY HISTORY:  No mental retardation.   PHYSICAL EXAMINATION:  VITAL SIGNS: Blood pressure 112/70.  HEENT: Throat clear.  LUNGS: Clear to auscultation bilaterally.  HEART: Regular rate and rhythm.  ABDOMEN: Gravid and nontender. Estimated fetal weight 7 pounds and 1 ounce.  Cervix 3 1/2, 80%, minus 2. Fetal heart tracings 140's and reactive.   ASSESSMENT/PLAN:  The patient is a 40 year old Gravida III, Para II at 39  weeks and late in labor. Will artificially rupture, though did have a light  meconium. Will continue to monitor her. The patient is GBS negative and will  not need antibiotic prophylaxis. If there is any question,  will place an  intrauterine pressure catheter in place and amnio infusion later.                                               Katy Fitch, M.D.    DC/MEDQ  D:   01/21/2002  T:  01/21/2002  Job:  [id number]

## 2010-12-19 ENCOUNTER — Encounter (HOSPITAL_COMMUNITY): Payer: Self-pay

## 2010-12-19 ENCOUNTER — Emergency Department (HOSPITAL_COMMUNITY)
Admission: EM | Admit: 2010-12-19 | Discharge: 2010-12-19 | Disposition: A | Discharge status: Home / Self Care | Payer: Self-pay | Source: Home / Self Care | Attending: Emergency Medicine | Admitting: Emergency Medicine

## 2010-12-19 DIAGNOSIS — R05 Cough: Secondary | ICD-10-CM

## 2010-12-19 DIAGNOSIS — R059 Cough, unspecified: Secondary | ICD-10-CM

## 2010-12-19 DIAGNOSIS — L299 Pruritus, unspecified: Secondary | ICD-10-CM

## 2010-12-19 HISTORY — DX: Essential (primary) hypertension: I10

## 2010-12-19 HISTORY — DX: Hyperlipidemia, unspecified: E78.5

## 2010-12-19 MED ORDER — HYDROXYZINE HCL 25 MG PO TABS: 25.0000 mg | ORAL_TABLET | Freq: Four times a day (QID) | ORAL | Status: AC

## 2010-12-19 MED ORDER — PREDNISONE 10 MG PO TABS: 20.0000 mg | ORAL_TABLET | Freq: Every day | ORAL | Status: AC

## 2010-12-19 NOTE — ED Provider Notes (Signed)
History     CSN: [id number] Arrival date & time: 12/19/2010  1:21 PM   First MD Initiated Contact with Patient 12/19/10 1254      Chief Complaint  Patient presents with  . Cough    (Consider location/radiation/quality/duration/timing/severity/associated sxs/prior treatment) HPI  Past Medical History  Diagnosis Date  . Hypertension   . Hyperlipidemia     History reviewed. No pertinent past surgical history.  History reviewed. No pertinent family history.  History  Substance Use Topics  . Smoking status: Never Smoker   . Smokeless tobacco: Not on file  . Alcohol Use: No    OB History    Grav Para Term Preterm Abortions TAB SAB Ect Mult Living   3 3              Review of Systems  Allergies  Review of patient's allergies indicates not on file.  Home Medications   Current Outpatient Rx  Name Route Sig Dispense Refill  . ESOMEPRAZOLE MAGNESIUM 40 MG PO CPDR Oral Take 40 mg by mouth daily before breakfast.      . HYDROCHLOROTHIAZIDE 12.5 MG PO CAPS Oral Take 12.5 mg by mouth daily.      Marland Kitchen SIMVASTATIN 40 MG PO TABS Oral Take 40 mg by mouth at bedtime.        BP 178/98  Pulse 78  Temp(Src) 99.3 F (37.4 C) (Oral)  Resp 18  SpO2 100%  LMP 12/15/2010  Physical Exam  ED Course  Procedures (including critical care time)  Labs Reviewed - No data to display No results found.   No diagnosis found.    MDM  Cough-itchy throat and skin- Normal exam        Jimmie Molly, MD 12/19/10 1419

## 2010-12-19 NOTE — ED Notes (Signed)
Cough x 2 days; Patient has been using OTC medication with little relief.  Cough is worse at night.  Patient denies fever;  Patient states cough is dry

## 2013-12-08 ENCOUNTER — Encounter (HOSPITAL_COMMUNITY): Payer: Self-pay

## 2014-08-03 ENCOUNTER — Ambulatory Visit: Payer: 59 | Admitting: Podiatry

## 2014-08-11 ENCOUNTER — Ambulatory Visit (INDEPENDENT_AMBULATORY_CARE_PROVIDER_SITE_OTHER): Payer: 59

## 2014-08-11 ENCOUNTER — Encounter: Payer: Self-pay | Admitting: Podiatry

## 2014-08-11 ENCOUNTER — Ambulatory Visit (INDEPENDENT_AMBULATORY_CARE_PROVIDER_SITE_OTHER): Payer: 59 | Admitting: Podiatry

## 2014-08-11 VITALS — BP 144/86 | HR 73 | Resp 15

## 2014-08-11 DIAGNOSIS — M79672 Pain in left foot: Principal | ICD-10-CM

## 2014-08-11 DIAGNOSIS — M79671 Pain in right foot: Secondary | ICD-10-CM

## 2014-08-11 DIAGNOSIS — M722 Plantar fascial fibromatosis: Secondary | ICD-10-CM

## 2014-08-11 MED ORDER — TRIAMCINOLONE ACETONIDE 10 MG/ML IJ SUSP
10.0000 mg | Freq: Once | INTRAMUSCULAR | Status: AC
  Administered 2014-08-11: 10 mg

## 2014-08-11 MED ORDER — DICLOFENAC SODIUM 75 MG PO TBEC: 75.0000 mg | DELAYED_RELEASE_TABLET | Freq: Two times a day (BID) | ORAL | Status: DC

## 2014-08-11 NOTE — Progress Notes (Signed)
Subjective:     Patient ID: Shannon Rich T Helwig, female   DOB: Mar 06, 1970, 44 y.o.   MRN: [id number]  HPI patient presents stating my heel is hurting right over left and it's been going on for around 6 months. I did have one another injection done which gave me temporary relief of symptoms but I've had significant reoccurrence over the last 4-5 months and I did not enjoy what he did to me   Review of Systems  All other systems reviewed and are negative.      Objective:   Physical Exam  Constitutional: She is oriented to person, place, and time.  Cardiovascular: Intact distal pulses.   Musculoskeletal: Normal range of motion.  Neurological: She is oriented to person, place, and time.  Skin: Skin is warm.  Nursing note and vitals reviewed.  neurovascular status intact muscle strength adequate with range of motion of the subtalar midtarsal joint within normal limits. Patient's noted to have quite a bit of discomfort in the plantar heel right at the insertional point of the tendon the calcaneus with inflammation and fluid around the medial band with discomfort left also moderate nature and moderate depression of the arch also noted. Has good digital perfusion and is well oriented 3     Assessment:     Plantar fasciitis right over left with inflammation and fluid buildup    Plan:     H&P and x-rays reviewed with patient. Today I injected the plantar fascia right 3 mg Kenalog 5 mill grams Xylocaine and applied fascial brace and placed on diclofenac 75 mg twice a day. Reappoint to recheck

## 2014-08-11 NOTE — Patient Instructions (Signed)

## 2014-08-11 NOTE — Progress Notes (Signed)
   Subjective:    Patient ID: Shannon Rich, female    DOB: 08/20/1970, 44 y.o.   MRN: [id number]  HPI B/L foot pain; more pain on Right foot, lateral side and heel of foot; more painful at night; Going on for the past 6 months. Dr. Andrey CampanileWilson, [facility], referred patient to come here today.   Review of Systems  All other systems reviewed and are negative.      Objective:   Physical Exam        Assessment & Plan:

## 2014-08-18 ENCOUNTER — Ambulatory Visit (INDEPENDENT_AMBULATORY_CARE_PROVIDER_SITE_OTHER): Payer: 59 | Admitting: Podiatry

## 2014-08-18 ENCOUNTER — Encounter: Payer: Self-pay | Admitting: Podiatry

## 2014-08-18 VITALS — BP 144/98 | HR 71 | Resp 12

## 2014-08-18 DIAGNOSIS — M722 Plantar fascial fibromatosis: Secondary | ICD-10-CM

## 2014-08-18 MED ORDER — TRIAMCINOLONE ACETONIDE 10 MG/ML IJ SUSP
10.0000 mg | Freq: Once | INTRAMUSCULAR | Status: AC
  Administered 2014-08-18: 10 mg

## 2014-08-18 NOTE — Progress Notes (Signed)
Subjective:     Patient ID: Shannon Rich, female   DOB: 12-30-1970, 44 y.o.   MRN: [id number]  HPI patient states my right heel is around 30% better but still is very sore in the morning or after periods of sitting   Review of Systems     Objective:   Physical Exam Neurovascular status intact muscle strength adequate with continued discomfort plantar aspect right heel at the insertional point tendon into the calcaneus    Assessment:     Plantar fasciitis right with inflammation and fluid around the medial band still present but moderate improvement    Plan:     Reinjected the plantar fascia 3 mg Kenalog 5 mg Xylocaine and dispensed fascial night splint with instructions on usage and reappoint in 5 weeks

## 2014-08-18 NOTE — Progress Notes (Signed)
   Subjective:    Patient ID: Shannon Rich, female    DOB: [birth year], 44 y.o.   MRN: [id number]  HPI Patient is following up on R Plantar fasciitis from previous week. She still has pain at the end of the day when she is resting and less so when she is on her feet during the day.   Review of Systems     Objective:   Physical Exam        Assessment & Plan:

## 2014-09-21 ENCOUNTER — Ambulatory Visit (INDEPENDENT_AMBULATORY_CARE_PROVIDER_SITE_OTHER): Payer: 59 | Admitting: Podiatry

## 2014-09-21 ENCOUNTER — Encounter: Payer: Self-pay | Admitting: Podiatry

## 2014-09-21 VITALS — BP 153/97 | HR 97 | Resp 69

## 2014-09-21 DIAGNOSIS — M722 Plantar fascial fibromatosis: Secondary | ICD-10-CM

## 2014-09-21 MED ORDER — MELOXICAM 15 MG PO TABS: 15.0000 mg | ORAL_TABLET | Freq: Every day | ORAL | Status: DC

## 2014-09-22 ENCOUNTER — Ambulatory Visit: Payer: 59 | Admitting: Podiatry

## 2014-09-23 NOTE — Progress Notes (Signed)
Subjective:     Patient ID: Shannon Rich, female   DOB: 1970-11-15, 44 y.o.   MRN: [id number]  HPI patient presents stating it is doing better with occasional discomfort if I walk too much   Review of Systems     Objective:   Physical Exam Neurovascular status intact muscle strength adequate range of motion within normal limits with discomfort on the plantar aspect right heel which is mild in nature and requires deep palpation in order to create symptoms    Assessment:     Improving plantar fasciitis right heel    Plan:     Advised on physical therapy anti-inflammatory is continued supportive shoe and discharge and less symptoms were to get worse

## 2014-12-14 ENCOUNTER — Other Ambulatory Visit: Payer: Self-pay | Admitting: Podiatry

## 2014-12-15 NOTE — Telephone Encounter (Signed)
Pt must make an appt prior to future refills. 

## 2015-03-01 ENCOUNTER — Other Ambulatory Visit: Payer: Self-pay | Admitting: Podiatry

## 2020-12-16 ENCOUNTER — Ambulatory Visit: Payer: Self-pay | Admitting: Orthopedic Surgery

## 2021-10-20 ENCOUNTER — Ambulatory Visit (INDEPENDENT_AMBULATORY_CARE_PROVIDER_SITE_OTHER): Payer: Commercial Managed Care - HMO | Admitting: Family Medicine

## 2021-10-20 ENCOUNTER — Encounter: Payer: Self-pay | Admitting: Family Medicine

## 2021-10-20 VITALS — BP 124/82 | HR 77 | Temp 97.5°F | Ht 61.5 in | Wt 162.6 lb

## 2021-10-20 DIAGNOSIS — I10 Essential (primary) hypertension: Secondary | ICD-10-CM | POA: Diagnosis not present

## 2021-10-20 DIAGNOSIS — E785 Hyperlipidemia, unspecified: Secondary | ICD-10-CM

## 2021-10-20 DIAGNOSIS — K219 Gastro-esophageal reflux disease without esophagitis: Secondary | ICD-10-CM | POA: Diagnosis not present

## 2021-10-20 DIAGNOSIS — G5603 Carpal tunnel syndrome, bilateral upper limbs: Secondary | ICD-10-CM | POA: Diagnosis not present

## 2021-10-20 MED ORDER — ESOMEPRAZOLE MAGNESIUM 40 MG PO CPDR: 40.0000 mg | DELAYED_RELEASE_CAPSULE | Freq: Every day | ORAL | 0 refills | Status: DC

## 2021-10-20 MED ORDER — PRAVASTATIN SODIUM 80 MG PO TABS: 80.0000 mg | ORAL_TABLET | Freq: Every day | ORAL | 0 refills | Status: DC

## 2021-10-20 MED ORDER — PREDNISONE 10 MG (21) PO TBPK: ORAL_TABLET | ORAL | 0 refills | Status: DC

## 2021-10-20 MED ORDER — LOSARTAN POTASSIUM 100 MG PO TABS: 100.0000 mg | ORAL_TABLET | Freq: Every day | ORAL | 0 refills | Status: DC

## 2021-10-20 NOTE — Assessment & Plan Note (Signed)
Worsening Trial prednisone Referral to orthopedics

## 2021-10-20 NOTE — Progress Notes (Signed)
Assessment/Plan:   Problem List Items Addressed This Visit       Cardiovascular and Mediastinum   Hypertension    Stable Refill losartan 100 mg Follow-up in 1 month for restratification labs CMP, CBC, hemoglobin A1c, micro urine albuminemia, lipid panel      Relevant Medications   losartan (COZAAR) 100 MG tablet   pravastatin (PRAVACHOL) 80 MG tablet     Digestive   GERD (gastroesophageal reflux disease)    Stable Refill omeprazole Follow-up 1 month Add CBC      Relevant Medications   esomeprazole (NEXIUM) 40 MG capsule     Nervous and Auditory   Bilateral carpal tunnel syndrome - Primary    Worsening Trial prednisone Referral to orthopedics      Relevant Medications   predniSONE (STERAPRED UNI-PAK 21 TAB) 10 MG (21) TBPK tablet   Other Relevant Orders   Ambulatory referral to Orthopedics     Other   Hyperlipidemia    Pravastatin refilled Follow-up in 1 month fasting for repeat lipid panel and restratification labs      Relevant Medications   losartan (COZAAR) 100 MG tablet   pravastatin (PRAVACHOL) 80 MG tablet       Subjective:  HPI:  Shannon Rich is a 51 y.o. female who has Hypertension; CERUMEN IMPACTION, RIGHT; ACUTE NASOPHARYNGITIS; HEMATURIA UNSPECIFIED; PLANTAR FASCIITIS, BILATERAL; ALLERGIC RHINITIS; GERD (gastroesophageal reflux disease); Bilateral carpal tunnel syndrome; and Hyperlipidemia on their problem list..   She  has a past medical history of Hyperlipidemia and Hypertension..   She presents with chief complaint of Establish Care (Left hand numbing for a couple months. Was seeing hand doctor and received shots in hand, but now pain has returned. Patient would like to discuss colorguard.) .   Hypertension, established problem, Stable BP Readings from Last 3 Encounters:  10/20/21 124/82  09/21/14 (!) 153/97  08/18/14 (!) 144/98   Home BP monitoring:  Current Medications: unsure says dosage is 100 mg, prior chart shows patient  with history of losartan use, compliant without side effects. Interim History:   ROS: Denies any chest pain, shortness of breath, dyspnea on exertion, leg edema.     Carpal Tunnel Syndrome: Patient presents for presents evaluation of pain in hands and hand paresthesias.  Onset of the symptoms was several years ago, were getting injections. Current symptoms include tingling involving the all digits finger tips aspect of the both hand, of moderate severity. Inciting event/aggravating factors: none known Patient's course of TG:GYIRS worsening 2 months ago. Evaluation to date: orthopedics evaluation: [facility] .  Treatment to date: prescription NSAIDS, which has been not very effective and steroid wrist injections that were somewhat helpful .NSAID taken was meloxicam.  Hyperlipidemia, established problem, Current medication(s): Pravastatin 80 mg.  Compliant without side effects.  ROS: No chest pain or shortness of breath. No myalgias.  Labs available in chart or from 10/2020, lipid panel with total cholesterol 212 and LDL 135,, CMP unremarkable, CBC without anemia, TSH 2.21  GERD:  Patient has history of GERD.  Symptoms controlled on esomeprazole 40 mg.    She reports  being without prescription   She is not having side effects. .  She IS experiencing heartburn. She is NOT experiencing dysphagia  -----------------------------------------------------------------------------------------  Past Surgical History:  Procedure Laterality Date   CESAREAN SECTION      Outpatient Medications Prior to Visit  Medication Sig Dispense Refill   diclofenac (VOLTAREN) 75 MG EC tablet TAKE ONE TABLET BY MOUTH TWICE DAILY  50 tablet 0   esomeprazole (NEXIUM) 40 MG capsule Take 40 mg by mouth daily before breakfast.       hydrochlorothiazide (MICROZIDE) 12.5 MG capsule Take 12.5 mg by mouth daily.       losartan (COZAAR) 100 MG tablet Take 100 mg by mouth daily.     meloxicam (MOBIC) 15 MG tablet  Take 1 tablet (15 mg total) by mouth daily. 30 tablet 2   pravastatin (PRAVACHOL) 80 MG tablet Take 80 mg by mouth daily.     No facility-administered medications prior to visit.    History reviewed. No pertinent family history.  Social History   Socioeconomic History   Marital status: Married    Spouse name: Not on file   Number of children: Not on file   Years of education: Not on file   Highest education level: Not on file  Occupational History   Not on file  Tobacco Use   Smoking status: Never    Passive exposure: Never   Smokeless tobacco: Not on file  Vaping Use   Vaping Use: Never used  Substance and Sexual Activity   Alcohol use: No   Drug use: No   Sexual activity: Not on file  Other Topics Concern   Not on file  Social History Narrative   Not on file   Social Determinants of Health   Financial Resource Strain: Not on file  Food Insecurity: Not on file  Transportation Needs: Not on file  Physical Activity: Not on file  Stress: Not on file  Social Connections: Not on file  Intimate Partner Violence: Not on file                                                                                                 Objective:  Physical Exam: BP 124/82 (BP Location: Left Arm, Patient Position: Sitting, Cuff Size: Large)   Pulse 77   Temp (!) 97.5 F (36.4 C) (Temporal)   Ht 5' 1.5" (1.562 m)   Wt 162 lb 9.6 oz (73.8 kg)   SpO2 98%   BMI 30.23 kg/m    General: No acute distress. Awake and conversant.  Eyes: Normal conjunctiva, anicteric. Round symmetric pupils.  ENT: Hearing grossly intact. No nasal discharge.  Neck: Neck is supple. No masses or thyromegaly.  Respiratory: Respirations are non-labored. No auditory wheezing.  CTA B Skin: Warm. No rashes or ulcers.  Psych: Alert and oriented. Cooperative, Appropriate mood and affect, Normal judgment.  CV: No cyanosis or JVD, RRR, no MRG ABD: Nontender nondistended MSK: Normal ambulation. No clubbing   Neuro: Sensation and CN II-XII grossly normal.        Garner Nash, MD, MS

## 2021-10-20 NOTE — Assessment & Plan Note (Signed)
Pravastatin refilled Follow-up in 1 month fasting for repeat lipid panel and restratification labs

## 2021-10-20 NOTE — Assessment & Plan Note (Signed)
Stable Refill omeprazole Follow-up 1 month Add CBC

## 2021-10-20 NOTE — Patient Instructions (Signed)
For hand numbness, take prednisone. We are referring to orthopedics for hand numbness.  For blood pressure, we refilled losartan.  For cholesterol, we refilled pravastatin.  For heart burn, we refilled esomeprazole.

## 2021-10-20 NOTE — Assessment & Plan Note (Signed)
Stable Refill losartan 100 mg Follow-up in 1 month for restratification labs CMP, CBC, hemoglobin A1c, micro urine albuminemia, lipid panel

## 2021-10-26 ENCOUNTER — Encounter: Payer: Self-pay | Admitting: Orthopaedic Surgery

## 2021-10-26 ENCOUNTER — Ambulatory Visit: Payer: Commercial Managed Care - HMO | Admitting: Orthopaedic Surgery

## 2021-10-26 DIAGNOSIS — G5602 Carpal tunnel syndrome, left upper limb: Secondary | ICD-10-CM

## 2021-10-26 DIAGNOSIS — G5601 Carpal tunnel syndrome, right upper limb: Secondary | ICD-10-CM

## 2021-10-26 NOTE — Progress Notes (Signed)
   Office Visit Note   Patient: Shannon Rich           Date of Birth: 1970-04-16           MRN: [id number] Visit Date: 10/26/2021              Requested by: [address], [address] [place],  [place] [id number] PCP: Libby Maw, MD   Assessment & Plan: Visit Diagnoses:  1. Right carpal tunnel syndrome   2. Left carpal tunnel syndrome     Plan: Impression is bilateral carpal tunnel syndrome right greater than left.  At this point, the patient does have multiple cortisone injections which no longer provide relief in symptoms.  She would like to proceed with carpal tunnel release.  We will proceed with the right, more symptomatic wrist first.  Risk, benefits and possible complications reviewed.  Rehab and recovery time discussed.  All questions were answered.  Follow-Up Instructions: No follow-ups on file.   Orders:  No orders of the defined types were placed in this encounter.  No orders of the defined types were placed in this encounter.     Procedures: No procedures performed   Clinical Data: No additional findings.   Subjective: Chief Complaint  Patient presents with   Right Hand - Pain, Numbness   Left Hand - Numbness, Pain    HPI patient is a pleasant 51 year old female who is here with bilateral hand pain and paresthesias right greater than left for the past 2 years.  She was diagnosed with carpal tunnel syndrome by Dr. Fredna Dow few years back.  She has had multiple cortisone injections to each wrist which no longer provide relief.  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  She notes paresthesias to all 5 fingers of both hands.  She denies any cervical spine pathology.  Review of Systems as detailed in HPI.  All other reviewed and are negative.   Objective: Vital Signs: There were no vitals taken for this visit.  Physical Exam well-developed  well-nourished female no acute distress.  Alert and oriented x3.  Ortho Exam bilateral hand exam: Fingers show decreased sensation.  No change with Phalen and Tinel testing at the wrist.  No thenar atrophy.  Specialty Comments:  No specialty comments available.  Imaging: No new imaging   PMFS History: Patient Active Problem List   Diagnosis Date Noted   GERD (gastroesophageal reflux disease) 10/20/2021   Bilateral carpal tunnel syndrome 10/20/2021   Hyperlipidemia 10/20/2021   ALLERGIC RHINITIS 04/14/2010   ACUTE NASOPHARYNGITIS 09/17/2009   CERUMEN IMPACTION, RIGHT 08/16/2009   HEMATURIA UNSPECIFIED 08/16/2009   Hypertension 04/28/2009   PLANTAR FASCIITIS, BILATERAL 04/28/2009   Past Medical History:  Diagnosis Date   Hyperlipidemia    Hypertension     No family history on file.  Past Surgical History:  Procedure Laterality Date   CESAREAN SECTION     Social History   Occupational History   Not on file  Tobacco Use   Smoking status: Never    Passive exposure: Never   Smokeless tobacco: Not on file  Vaping Use   Vaping Use: Never used  Substance and Sexual Activity   Alcohol use: No   Drug use: No   Sexual activity: Not on file

## 2021-11-02 ENCOUNTER — Telehealth: Payer: Self-pay | Admitting: Orthopaedic Surgery

## 2021-11-09 ENCOUNTER — Ambulatory Visit (HOSPITAL_BASED_OUTPATIENT_CLINIC_OR_DEPARTMENT_OTHER)
Admission: RE | Admit: 2021-11-09 | Payer: Commercial Managed Care - HMO | Source: Home / Self Care | Admitting: Orthopaedic Surgery

## 2021-11-09 ENCOUNTER — Encounter (HOSPITAL_BASED_OUTPATIENT_CLINIC_OR_DEPARTMENT_OTHER): Admission: RE | Payer: Self-pay | Source: Home / Self Care

## 2021-11-09 SURGERY — CARPAL TUNNEL RELEASE
Anesthesia: Monitor Anesthesia Care | Laterality: Right

## 2021-11-16 ENCOUNTER — Encounter: Payer: Commercial Managed Care - HMO | Admitting: Orthopaedic Surgery

## 2021-11-16 ENCOUNTER — Other Ambulatory Visit: Payer: Self-pay | Admitting: Family Medicine

## 2021-11-16 DIAGNOSIS — K219 Gastro-esophageal reflux disease without esophagitis: Secondary | ICD-10-CM

## 2021-11-16 NOTE — Telephone Encounter (Signed)
Chart supports rx. Last OV: 10/20/2021 Next OV: 11/17/2021

## 2021-11-17 ENCOUNTER — Encounter: Payer: Commercial Managed Care - HMO | Admitting: Family Medicine

## 2021-11-17 ENCOUNTER — Telehealth: Payer: Self-pay | Admitting: Family Medicine

## 2021-11-17 NOTE — Telephone Encounter (Signed)
Pt was a no show for a CPE on 10/12 with Dr. Ethelene Hal. This is her first, letter has been sent. Her PCP should be Dr. Grandville Silos as her first, new patient visit was on 9/14 with Dr. Grandville Silos so I changed it

## 2021-11-23 NOTE — Telephone Encounter (Signed)
1st no show, fee waived, letter sent Pt should have been scheduled for CPE with Dr. Grandville Silos (PCP)

## 2021-12-12 ENCOUNTER — Other Ambulatory Visit: Payer: Self-pay | Admitting: Family Medicine

## 2021-12-12 DIAGNOSIS — K219 Gastro-esophageal reflux disease without esophagitis: Secondary | ICD-10-CM

## 2021-12-12 NOTE — Telephone Encounter (Signed)
Chart supports rx. Last OV: 10/20/2021

## 2021-12-21 ENCOUNTER — Ambulatory Visit (INDEPENDENT_AMBULATORY_CARE_PROVIDER_SITE_OTHER): Payer: Commercial Managed Care - HMO | Admitting: Family Medicine

## 2021-12-21 ENCOUNTER — Encounter: Payer: Self-pay | Admitting: Family Medicine

## 2021-12-21 VITALS — BP 120/80 | HR 79 | Temp 97.6°F | Wt 164.2 lb

## 2021-12-21 DIAGNOSIS — Z0000 Encounter for general adult medical examination without abnormal findings: Secondary | ICD-10-CM

## 2021-12-21 DIAGNOSIS — E785 Hyperlipidemia, unspecified: Secondary | ICD-10-CM | POA: Diagnosis not present

## 2021-12-21 DIAGNOSIS — Z0001 Encounter for general adult medical examination with abnormal findings: Secondary | ICD-10-CM

## 2021-12-21 DIAGNOSIS — L309 Dermatitis, unspecified: Secondary | ICD-10-CM | POA: Diagnosis not present

## 2021-12-21 DIAGNOSIS — E669 Obesity, unspecified: Secondary | ICD-10-CM

## 2021-12-21 DIAGNOSIS — Z23 Encounter for immunization: Secondary | ICD-10-CM | POA: Diagnosis not present

## 2021-12-21 DIAGNOSIS — I10 Essential (primary) hypertension: Secondary | ICD-10-CM

## 2021-12-21 DIAGNOSIS — Z1159 Encounter for screening for other viral diseases: Secondary | ICD-10-CM

## 2021-12-21 DIAGNOSIS — Z6830 Body mass index (BMI) 30.0-30.9, adult: Secondary | ICD-10-CM

## 2021-12-21 LAB — COMPREHENSIVE METABOLIC PANEL
ALT: 15 U/L (ref 0–35)
AST: 17 U/L (ref 0–37)
Albumin: 4.5 g/dL (ref 3.5–5.2)
Alkaline Phosphatase: 73 U/L (ref 39–117)
BUN: 11 mg/dL (ref 6–23)
CO2: 29 mEq/L (ref 19–32)
Calcium: 9.9 mg/dL (ref 8.4–10.5)
Chloride: 103 mEq/L (ref 96–112)
Creatinine, Ser: 0.67 mg/dL (ref 0.40–1.20)
GFR: 99.67 mL/min (ref >60.00)
Glucose, Bld: 93 mg/dL (ref 70–99)
Potassium: 4.3 mEq/L (ref 3.5–5.1)
Sodium: 140 mEq/L (ref 135–145)
Total Bilirubin: 0.6 mg/dL (ref 0.2–1.2)
Total Protein: 7.4 g/dL (ref 6.0–8.3)

## 2021-12-21 LAB — LIPID PANEL
Cholesterol: 182 mg/dL (ref 0–200)
HDL: 55 mg/dL (ref >39.00)
LDL Cholesterol: 104 mg/dL — ABNORMAL HIGH (ref 0–99)
NonHDL: 126.97
Total CHOL/HDL Ratio: 3
Triglycerides: 114 mg/dL (ref 0.0–149.0)
VLDL: 22.8 mg/dL (ref 0.0–40.0)

## 2021-12-21 LAB — CBC WITH DIFFERENTIAL/PLATELET
Basophils Absolute: 0.1 10*3/uL (ref 0.0–0.1)
Basophils Relative: 0.7 % (ref 0.0–3.0)
Eosinophils Absolute: 0.1 10*3/uL (ref 0.0–0.7)
Eosinophils Relative: 1.2 % (ref 0.0–5.0)
HCT: 41.5 % (ref 36.0–46.0)
Hemoglobin: 13.4 g/dL (ref 12.0–15.0)
Lymphocytes Relative: 15.3 % (ref 12.0–46.0)
Lymphs Abs: 1.3 10*3/uL (ref 0.7–4.0)
MCHC: 32.3 g/dL (ref 30.0–36.0)
MCV: 84.3 fl (ref 78.0–100.0)
Monocytes Absolute: 0.6 10*3/uL (ref 0.1–1.0)
Monocytes Relative: 7.5 % (ref 3.0–12.0)
Neutro Abs: 6.5 10*3/uL (ref 1.4–7.7)
Neutrophils Relative %: 75.3 % (ref 43.0–77.0)
Platelets: 401 10*3/uL — ABNORMAL HIGH (ref 150.0–400.0)
RBC: 4.93 Mil/uL (ref 3.87–5.11)
RDW: 13.8 % (ref 11.5–15.5)
WBC: 8.7 10*3/uL (ref 4.0–10.5)

## 2021-12-21 LAB — MICROALBUMIN / CREATININE URINE RATIO
Creatinine,U: 56.3 mg/dL
Microalb Creat Ratio: 1.2 mg/g (ref 0.0–30.0)
Microalb, Ur: <0.7 mg/dL (ref 0.0–1.9)

## 2021-12-21 LAB — HEMOGLOBIN A1C: Hgb A1c MFr Bld: 6 % (ref 4.6–6.5)

## 2021-12-21 LAB — TSH: TSH: 1.63 u[IU]/mL (ref 0.35–5.50)

## 2021-12-21 MED ORDER — TRIAMCINOLONE ACETONIDE 0.1 % EX CREA: 1.0000 | TOPICAL_CREAM | Freq: Two times a day (BID) | CUTANEOUS | 0 refills | Status: AC

## 2021-12-21 MED ORDER — PRAVASTATIN SODIUM 80 MG PO TABS: 80.0000 mg | ORAL_TABLET | Freq: Every day | ORAL | 1 refills | Status: AC

## 2021-12-21 MED ORDER — LOSARTAN POTASSIUM 100 MG PO TABS: 100.0000 mg | ORAL_TABLET | Freq: Every day | ORAL | 1 refills | Status: AC

## 2021-12-21 MED ORDER — ZOSTER VAC RECOMB ADJUVANTED 50 MCG/0.5ML IM SUSR: 0.5000 mL | Freq: Once | INTRAMUSCULAR | 0 refills | Status: DC

## 2021-12-21 NOTE — Assessment & Plan Note (Signed)
Counseled on lifestyle including diet and exercise Restratification labs as ordered

## 2021-12-21 NOTE — Patient Instructions (Signed)
For hand eczema, use triamcinolone ointment.

## 2021-12-21 NOTE — Assessment & Plan Note (Signed)
Stable Refill losartan 100 mg for 6 months Restratification labs

## 2021-12-21 NOTE — Progress Notes (Signed)
Chief Complaint:  Shannon Rich is a 51 y.o. female who presents today for her annual comprehensive physical exam.    Assessment/Plan:   Problem List Items Addressed This Visit       Cardiovascular and Mediastinum   Hypertension   Relevant Medications   losartan (COZAAR) 100 MG tablet   pravastatin (PRAVACHOL) 80 MG tablet   Other Relevant Orders   TSH   Lipid panel   Hemoglobin A1c   Microalbumin / creatinine urine ratio   Urinalysis, Routine w reflex microscopic   CBC with Differential/Platelet   Comprehensive metabolic panel     Other   Hyperlipidemia   Relevant Medications   losartan (COZAAR) 100 MG tablet   pravastatin (PRAVACHOL) 80 MG tablet   Other Relevant Orders   TSH   Lipid panel   Hemoglobin A1c   Microalbumin / creatinine urine ratio   Urinalysis, Routine w reflex microscopic   CBC with Differential/Platelet   Comprehensive metabolic panel   Other Visit Diagnoses     Eczema, unspecified type    -  Primary   Relevant Medications   triamcinolone cream (KENALOG) 0.1 %   Encounter for well adult exam without abnormal findings       Relevant Orders   TSH   Lipid panel   Hemoglobin A1c   Microalbumin / creatinine urine ratio   Urinalysis, Routine w reflex microscopic   CBC with Differential/Platelet   Comprehensive metabolic panel   Class 1 obesity without serious comorbidity with body mass index (BMI) of 30.0 to 30.9 in adult, unspecified obesity type       Relevant Orders   TSH   Lipid panel   Hemoglobin A1c   Microalbumin / creatinine urine ratio   Urinalysis, Routine w reflex microscopic   CBC with Differential/Platelet   Comprehensive metabolic panel   Screening for viral disease       Relevant Orders   Hepatitis C Antibody   HIV antibody (with reflex)   Need for shingles vaccine       Relevant Orders   Zoster Recombinant (Shingrix ) (Completed)        Patient Counseling(The following topics were reviewed and/or handout was  given):  -Nutrition: Stressed importance of moderation in sodium/caffeine intake, saturated fat and cholesterol, caloric balance, sufficient intake of fresh fruits, vegetables, and fiber.  -Stressed the importance of regular exercise.   -Substance Abuse: Discussed cessation/primary prevention of tobacco, alcohol, or other drug use; driving or other dangerous activities under the influence; availability of treatment for abuse.   -Injury prevention: Discussed safety belts, safety helmets, smoke detector, smoking near bedding or upholstery.   -Sexuality: Discussed sexually transmitted diseases, partner selection, use of condoms, avoidance of unintended pregnancy and contraceptive alternatives.   -Dental health: Discussed importance of regular tooth brushing, flossing, and dental visits.  -Health maintenance and immunizations reviewed. Please refer to Health maintenance section.  Return to care in 1 year for next preventative visit.     Subjective:  HPI:  Eczema: Patient complains of rash recurrent eczema on thumbs and hand. Onset of symptoms several days ago, and have been unchanged since that time.  Treatment modalities that have been used in the past include: Use topical corticosteroids in the past.   Obesity.  Patient states that she is trying to lose weight but seems like her weight stays pretty constant. Diet: eats fruits and veggibles Exercise: walk 1 hr every day   Hypertension, established problem, Stable BP Readings from Last  3 Encounters:  12/21/21 120/80  10/20/21 124/82  09/21/14 (!) 153/97   Home BP monitoring: 120s/80s Current Medications: Losartan, compliant without side effects. Interim History:  Got covid shot yesterday,arm hurts, but improving, noticed that her blood pressures running on the high yesterday 170/90 ROS: Denies any chest pain, shortness of breath, dyspnea on exertion, leg edema.   Hyperlipidemia, established problem, Stable Current medication(s):  Pravastatin.  Compliant without side effects.  ROS: No chest pain or shortness of breath. No myalgias.        10/20/2021    8:40 AM  Depression screen PHQ 2/9  Decreased Interest 0  Down, Depressed, Hopeless 0  PHQ - 2 Score 0  Altered sleeping 0  Tired, decreased energy 0  Change in appetite 0  Feeling bad or failure about yourself  0  Trouble concentrating 0  Moving slowly or fidgety/restless 0  Suicidal thoughts 0  PHQ-9 Score 0  Difficult doing work/chores Not difficult at all    Health Maintenance Due  Topic Date Due   HIV Screening  Never done   Hepatitis C Screening  Never done       ROS: As per HPI, otherwise all systems reviewed and are negative  PMH:  The following were reviewed and entered/updated in epic: Past Medical History:  Diagnosis Date   Hyperlipidemia    Hypertension    Patient Active Problem List   Diagnosis Date Noted   GERD (gastroesophageal reflux disease) 10/20/2021   Bilateral carpal tunnel syndrome 10/20/2021   Hyperlipidemia 10/20/2021   ALLERGIC RHINITIS 04/14/2010   ACUTE NASOPHARYNGITIS 09/17/2009   CERUMEN IMPACTION, RIGHT 08/16/2009   HEMATURIA UNSPECIFIED 08/16/2009   Hypertension 04/28/2009   PLANTAR FASCIITIS, BILATERAL 04/28/2009   Past Surgical History:  Procedure Laterality Date   CESAREAN SECTION      History reviewed. No pertinent family history.  Medications- reviewed and updated Current Outpatient Medications  Medication Sig Dispense Refill   esomeprazole (NEXIUM) 40 MG capsule TAKE 1 CAPSULE BY MOUTH DAILY BEFORE BREAKFAST 30 capsule 0   triamcinolone cream (KENALOG) 0.1 % Apply 1 Application topically 2 (two) times daily. 30 g 0   losartan (COZAAR) 100 MG tablet Take 1 tablet (100 mg total) by mouth daily. 90 tablet 1   pravastatin (PRAVACHOL) 80 MG tablet Take 1 tablet (80 mg total) by mouth daily. 90 tablet 1   No current facility-administered medications for this visit.    Allergies-reviewed and  updated No Known Allergies  Social History   Socioeconomic History   Marital status: Married    Spouse name: Not on file   Number of children: Not on file   Years of education: Not on file   Highest education level: Not on file  Occupational History   Not on file  Tobacco Use   Smoking status: Never    Passive exposure: Never   Smokeless tobacco: Not on file  Vaping Use   Vaping Use: Never used  Substance and Sexual Activity   Alcohol use: No   Drug use: No   Sexual activity: Not on file  Other Topics Concern   Not on file  Social History Narrative   Not on file   Social Determinants of Health   Financial Resource Strain: Not on file  Food Insecurity: Not on file  Transportation Needs: Not on file  Physical Activity: Not on file  Stress: Not on file  Social Connections: Not on file        Objective:  Physical Exam:  BP 120/80 Comment: Home blood pressure  Pulse 79   Temp 97.6 F (36.4 C) (Temporal)   Wt 164 lb 3.2 oz (74.5 kg)   LMP 12/15/2010   SpO2 98%   BMI 30.52 kg/m   Body mass index is 30.52 kg/m. Wt Readings from Last 3 Encounters:  12/21/21 164 lb 3.2 oz (74.5 kg)  10/20/21 162 lb 9.6 oz (73.8 kg)  04/14/10 155 lb 12.8 oz (70.7 kg)    Gen: NAD, resting comfortably CV: RRR with no murmurs appreciated Pulm: NWOB, CTAB with no crackles, wheezes, or rhonchi GI: Normal bowel sounds present. Soft, Nontender, Nondistended. MSK: no edema, cyanosis, or clubbing noted Skin: Scaling on thumbs and back of hands Neuro: grossly normal, moves all extremities Psych: Normal affect and thought content      At today's visit, we discussed treatment options, associated risk and benefits, and engage in counseling as needed.  Additionally the following were reviewed: Past medical records, past medical and surgical history, family and social background, as well as relevant laboratory results, imaging findings, and specialty notes, where applicable.  This message  was generated using dictation software, and as a result, it may contain unintentional typos or errors.  Nevertheless, extensive effort was made to accurately convey at the pertinent aspects of the patient visit.    There may have been are other unrelated non-urgent complaints, but due to the busy schedule and the amount of time already spent with her, time does not permit to address these issues at today's visit. Another appointment may have or has been requested to review these additional issues.   Thomes Dinning, MD, MS

## 2021-12-21 NOTE — Assessment & Plan Note (Signed)
Refill pravastatin 80 mg for 6 months For repeat lipid panel and restratification labs

## 2021-12-22 LAB — HIV ANTIBODY (ROUTINE TESTING W REFLEX): HIV 1&2 Ab, 4th Generation: NONREACTIVE

## 2021-12-22 LAB — HEPATITIS C ANTIBODY: Hepatitis C Ab: NONREACTIVE

## 2022-01-09 ENCOUNTER — Other Ambulatory Visit: Payer: Self-pay | Admitting: Family Medicine

## 2022-01-09 DIAGNOSIS — K219 Gastro-esophageal reflux disease without esophagitis: Secondary | ICD-10-CM

## 2022-02-04 ENCOUNTER — Other Ambulatory Visit: Payer: Self-pay | Admitting: Family Medicine

## 2022-02-04 DIAGNOSIS — K219 Gastro-esophageal reflux disease without esophagitis: Secondary | ICD-10-CM
